# Patient Record
Sex: Female | Born: 1975 | Race: White | Hispanic: No | Marital: Married | Smoking: Former smoker
Health system: Southern US, Community
[De-identification: ages and names within clinical notes are randomized; demographics above are authoritative.]

## PROBLEM LIST (undated history)

## (undated) DIAGNOSIS — R51 Headache: Secondary | ICD-10-CM

## (undated) DIAGNOSIS — K589 Irritable bowel syndrome without diarrhea: Secondary | ICD-10-CM

## (undated) DIAGNOSIS — K219 Gastro-esophageal reflux disease without esophagitis: Secondary | ICD-10-CM

## (undated) DIAGNOSIS — B309 Viral conjunctivitis, unspecified: Secondary | ICD-10-CM

## (undated) DIAGNOSIS — R87619 Unspecified abnormal cytological findings in specimens from cervix uteri: Secondary | ICD-10-CM

## (undated) DIAGNOSIS — D509 Iron deficiency anemia, unspecified: Secondary | ICD-10-CM

## (undated) DIAGNOSIS — K449 Diaphragmatic hernia without obstruction or gangrene: Secondary | ICD-10-CM

## (undated) HISTORY — DX: Unspecified abnormal cytological findings in specimens from cervix uteri: R87.619

## (undated) HISTORY — DX: Headache: R51

## (undated) HISTORY — DX: Gastro-esophageal reflux disease without esophagitis: K21.9

## (undated) HISTORY — DX: Viral conjunctivitis, unspecified: B30.9

## (undated) HISTORY — DX: Irritable bowel syndrome, unspecified: K58.9

## (undated) HISTORY — DX: Iron deficiency anemia, unspecified: D50.9

## (undated) HISTORY — DX: Diaphragmatic hernia without obstruction or gangrene: K44.9

---

## 2005-11-14 ENCOUNTER — Other Ambulatory Visit: Admission: RE | Admit: 2005-11-14 | Discharge: 2005-11-14 | Payer: Self-pay | Admitting: Obstetrics & Gynecology

## 2007-01-09 ENCOUNTER — Other Ambulatory Visit: Admission: RE | Admit: 2007-01-09 | Discharge: 2007-01-09 | Payer: Self-pay | Admitting: Obstetrics & Gynecology

## 2007-05-23 ENCOUNTER — Other Ambulatory Visit: Admission: RE | Admit: 2007-05-23 | Discharge: 2007-05-23 | Payer: Self-pay | Admitting: Obstetrics & Gynecology

## 2007-06-21 ENCOUNTER — Ambulatory Visit (HOSPITAL_COMMUNITY): Admission: RE | Admit: 2007-06-21 | Discharge: 2007-06-21 | Payer: Self-pay | Admitting: Obstetrics & Gynecology

## 2007-08-22 ENCOUNTER — Other Ambulatory Visit: Admission: RE | Admit: 2007-08-22 | Discharge: 2007-08-22 | Payer: Self-pay | Admitting: Obstetrics & Gynecology

## 2007-09-16 HISTORY — PX: LEEP: SHX91

## 2007-10-03 ENCOUNTER — Ambulatory Visit (HOSPITAL_COMMUNITY): Admission: AD | Admit: 2007-10-03 | Discharge: 2007-10-03 | Payer: Self-pay | Admitting: Obstetrics & Gynecology

## 2007-10-03 ENCOUNTER — Encounter: Payer: Self-pay | Admitting: Obstetrics & Gynecology

## 2007-11-19 ENCOUNTER — Encounter: Admission: RE | Admit: 2007-11-19 | Discharge: 2007-11-19 | Payer: Self-pay | Admitting: Family Medicine

## 2008-01-23 ENCOUNTER — Other Ambulatory Visit: Admission: RE | Admit: 2008-01-23 | Discharge: 2008-01-23 | Payer: Self-pay | Admitting: Obstetrics & Gynecology

## 2008-03-16 HISTORY — PX: OTHER SURGICAL HISTORY: SHX169

## 2008-03-24 ENCOUNTER — Ambulatory Visit: Admission: RE | Admit: 2008-03-24 | Discharge: 2008-03-24 | Payer: Self-pay | Admitting: Gynecology

## 2008-04-14 ENCOUNTER — Encounter: Payer: Self-pay | Admitting: Obstetrics & Gynecology

## 2008-04-14 ENCOUNTER — Ambulatory Visit (HOSPITAL_BASED_OUTPATIENT_CLINIC_OR_DEPARTMENT_OTHER): Admission: RE | Admit: 2008-04-14 | Discharge: 2008-04-14 | Payer: Self-pay | Admitting: Obstetrics & Gynecology

## 2008-08-06 ENCOUNTER — Encounter: Payer: Self-pay | Admitting: Family Medicine

## 2008-08-12 ENCOUNTER — Other Ambulatory Visit: Admission: RE | Admit: 2008-08-12 | Discharge: 2008-08-12 | Payer: Self-pay | Admitting: Obstetrics & Gynecology

## 2008-09-15 HISTORY — PX: ABDOMINAL HYSTERECTOMY: SHX81

## 2008-09-18 ENCOUNTER — Inpatient Hospital Stay (HOSPITAL_COMMUNITY): Admission: RE | Admit: 2008-09-18 | Discharge: 2008-09-19 | Payer: Self-pay | Admitting: Obstetrics & Gynecology

## 2008-09-18 ENCOUNTER — Encounter: Payer: Self-pay | Admitting: Obstetrics & Gynecology

## 2009-03-01 ENCOUNTER — Ambulatory Visit: Payer: Self-pay | Admitting: Family Medicine

## 2009-03-01 DIAGNOSIS — D509 Iron deficiency anemia, unspecified: Secondary | ICD-10-CM

## 2009-03-01 DIAGNOSIS — K219 Gastro-esophageal reflux disease without esophagitis: Secondary | ICD-10-CM

## 2009-03-01 DIAGNOSIS — R51 Headache: Secondary | ICD-10-CM

## 2009-03-01 DIAGNOSIS — R519 Headache, unspecified: Secondary | ICD-10-CM | POA: Insufficient documentation

## 2009-03-01 HISTORY — DX: Gastro-esophageal reflux disease without esophagitis: K21.9

## 2009-03-01 HISTORY — DX: Headache: R51

## 2009-03-01 HISTORY — DX: Iron deficiency anemia, unspecified: D50.9

## 2009-03-03 ENCOUNTER — Ambulatory Visit: Payer: Self-pay | Admitting: Family Medicine

## 2009-05-25 ENCOUNTER — Telehealth: Payer: Self-pay | Admitting: Family Medicine

## 2009-09-14 ENCOUNTER — Ambulatory Visit: Payer: Self-pay | Admitting: Family Medicine

## 2009-09-14 LAB — CONVERTED CEMR LAB
ALT: 18 units/L (ref 0–35)
BUN: 14 mg/dL (ref 6–23)
Bilirubin, Direct: 0.1 mg/dL (ref 0.0–0.3)
Calcium: 9 mg/dL (ref 8.4–10.5)
Cholesterol: 187 mg/dL (ref 0–200)
Creatinine, Ser: 0.8 mg/dL (ref 0.4–1.2)
Eosinophils Relative: 0.8 % (ref 0.0–5.0)
GFR calc non Af Amer: 86.97 mL/min (ref >60)
HDL: 77.3 mg/dL (ref >39.00)
LDL Cholesterol: 99 mg/dL (ref 0–99)
Lymphocytes Relative: 20.3 % (ref 12.0–46.0)
MCV: 91.7 fL (ref 78.0–100.0)
Monocytes Absolute: 0.6 10*3/uL (ref 0.1–1.0)
Monocytes Relative: 7.4 % (ref 3.0–12.0)
Neutrophils Relative %: 70.8 % (ref 43.0–77.0)
Platelets: 202 10*3/uL (ref 150.0–400.0)
Total Bilirubin: 0.9 mg/dL (ref 0.3–1.2)
Triglycerides: 53 mg/dL (ref 0.0–149.0)
VLDL: 10.6 mg/dL (ref 0.0–40.0)
WBC: 7.7 10*3/uL (ref 4.5–10.5)

## 2009-09-22 ENCOUNTER — Ambulatory Visit: Payer: Self-pay | Admitting: Family Medicine

## 2009-09-22 DIAGNOSIS — R059 Cough, unspecified: Secondary | ICD-10-CM | POA: Insufficient documentation

## 2009-09-22 DIAGNOSIS — R05 Cough: Secondary | ICD-10-CM

## 2010-06-09 ENCOUNTER — Ambulatory Visit: Payer: Self-pay | Admitting: Family Medicine

## 2010-06-09 DIAGNOSIS — R1032 Left lower quadrant pain: Secondary | ICD-10-CM | POA: Insufficient documentation

## 2010-09-13 ENCOUNTER — Telehealth: Payer: Self-pay | Admitting: Family Medicine

## 2010-11-15 NOTE — Assessment & Plan Note (Signed)
Summary: ab bloatness//ccm   Vital Signs:  Patient profile:   35 year old female Menstrual status:  hysterectomy Weight:      175 pounds Temp:     98.6 degrees F oral BP sitting:   120 / 78  (left arm) Cuff size:   regular  Vitals Entered By: Sid Falcon LPN (June 09, 2010 8:25 AM)  History of Present Illness: Patient seen with abdominal pain past several months. Left lower quadrant greater than right lower quadrant.  Patient saw her gynecologist last week and pelvic exam reportedly normal. Patient was placed on oral contraceptive pills and probiotics without much improvement. She relates several months of intermittent moderate occasionally severe sometimes sharp sometimes crampy pain left lower quadrant. Recently started a job and thinks stress may be a trigger. She has frequent constipation sometimes goes several days without bowel movement. No diarrhea. Question of IBS history. Exercises regularly. Takes plenty of fiber.  Denies any recent appetite changes. No weight loss and in fact has had some recent weight gain. No bloody stools. No fever or chills. Denies nausea or vomiting. Had recent lab work through her employer with normal CBC and normal thyroid function. No family history of bowel problem.  Menses regular.  Allergies: 1)  ! Penicillin V Potassium (Penicillin V Potassium)  Past History:  Past Medical History: Last updated: 03/01/2009 Anemia-iron deficiency GERD Headache/Migraines Hayfever/Allergies  Past Surgical History: Last updated: 03/01/2009 Hysterectomy 2009  Family History: Last updated: 03/01/2009 Family History of Alcoholism/Addiction, blood relative Family History of Arthritis, grandmother Family history lung cancer, grandparent Family history heart diesase, grandparent Family History Hypertension, parent, grandparent diverticulitis, parent  Social History: Last updated: 03/01/2009 Occupation: currently unemployed Married PMH-FH-SH reviewed  for relevance  Review of Systems  The patient denies anorexia, fever, weight loss, melena, hematochezia, severe indigestion/heartburn, hematuria, incontinence, and suspicious skin lesions.    Physical Exam  General:  Well-developed,well-nourished,in no acute distress; alert,appropriate and cooperative throughout examination Mouth:  Oral mucosa and oropharynx without lesions or exudates.  Teeth in good repair. Neck:  No deformities, masses, or tenderness noted. Lungs:  Normal respiratory effort, chest expands symmetrically. Lungs are clear to auscultation, no crackles or wheezes. Heart:  Normal rate and regular rhythm. S1 and S2 normal without gallop, murmur, click, rub or other extra sounds. Abdomen:  soft, normal bowel sounds, no distention, no masses, no hepatomegaly, and no splenomegaly.  very minimal tenderness left lower quadrant to deep palpation   Impression & Recommendations:  Problem # 1:  ABDOMINAL PAIN, LEFT LOWER QUADRANT (ICD-789.04) Assessment New  patient presents with predominant constipation and intermittent pain left lower quadrant. Question of constipation predominant IBS triggered by recent stress. She does not have any red flags such as weight loss, appetite change, or bloody stools and recent labs unremarkable. Continue exercise, fiber segmentation and trial of Amitiza 24 mcg micrograms twice daily. pt with prior hysterectomy so no risk of pregnancy.  Her updated medication list for this problem includes:    Amitiza 24 Mcg Caps (Lubiprostone) ..... One by mouth two times a day  Complete Medication List: 1)  Aciphex 20 Mg Tbec (Rabeprazole sodium) .... Once daily 2)  Anti-hist Allergy 25 Mg Tabs (Diphenhydramine hcl) .... As needed 3)  Calcium 600 1500 Mg Tabs (Calcium carbonate) .... Once daily 4)  Amitiza 24 Mcg Caps (Lubiprostone) .... One by mouth two times a day  Patient Instructions: 1)  Continue regular exercise. 2)  Drinking of fluids and get at least 25  g  fiber in diet per day 3)  Please schedule a follow-up appointment in 1 month.  Prescriptions: AMITIZA 24 MCG CAPS (LUBIPROSTONE) one by mouth two times a day  #60 x 1   Entered and Authorized by:   Evelena Peat MD   Signed by:   Evelena Peat MD on 06/09/2010   Method used:   Electronically to        [facility] [address] (retail)       [address]       [place], [place]  [address]       Ph: [phone]       Fax: [phone]   RxID:   [id number]

## 2010-11-15 NOTE — Progress Notes (Signed)
Summary: recommendation requested, hip pain  Phone Note Call from Patient Call back at Work Phone [phone]   Caller: Patient---live call Summary of Call: need recommendation of a doctor. She is having muscle pain in her hip area. Initial call taken by: Warnell Forester,  September 13, 2010 1:42 PM  Follow-up for Phone Call        We would be happy to evaluate, but if she is wanting to see orthopedic group set up with [facility] orthopedics. Follow-up by: Evelena Peat MD,  September 13, 2010 6:42 PM  Additional Follow-up for Phone Call Additional follow up Details #1::        Pt informed on personally identified VM Additional Follow-up by: Sid Falcon LPN,  September 14, 2010 10:22 AM

## 2011-02-13 ENCOUNTER — Ambulatory Visit (INDEPENDENT_AMBULATORY_CARE_PROVIDER_SITE_OTHER): Payer: Managed Care, Other (non HMO) | Admitting: Family Medicine

## 2011-02-13 ENCOUNTER — Encounter: Payer: Self-pay | Admitting: Family Medicine

## 2011-02-13 VITALS — BP 130/80 | Temp 98.7°F | Ht 66.0 in | Wt 171.0 lb

## 2011-02-13 DIAGNOSIS — R05 Cough: Secondary | ICD-10-CM

## 2011-02-13 MED ORDER — HYDROCODONE-HOMATROPINE 5-1.5 MG/5ML PO SYRP: 5.0000 mL | ORAL_SOLUTION | Freq: Four times a day (QID) | ORAL | Status: AC | PRN

## 2011-02-13 NOTE — Progress Notes (Signed)
  Subjective:    Patient ID: Monica Bailey, female    DOB: 24-Sep-1976, 35 y.o.   MRN: [id number]  HPI Patient seen with five-day history of cough and nasal congestion. Postnasal drip symptoms. Intermittent sore throat. No fever. Sudafed and Benadryl with minimal relief. She questions whether this is allergy related. Denies any body aches or chills. No nausea or vomiting. She is nonsmoker. Cough especially bothersome at night. No wheezing and no dyspnea. No history of reactive airway disease.   Review of Systems  Constitutional: Negative for fever and chills.  HENT: Positive for sore throat and postnasal drip. Negative for ear pain.   Respiratory: Positive for cough. Negative for shortness of breath and wheezing.   Cardiovascular: Negative for chest pain and leg swelling.       Objective:   Physical Exam  Constitutional: She appears well-developed and well-nourished.  HENT:  Right Ear: External ear normal.  Left Ear: External ear normal.  Mouth/Throat: Oropharynx is clear and moist. No oropharyngeal exudate.  Neck: No thyromegaly present.  Cardiovascular: Normal rate, regular rhythm and normal heart sounds.   No murmur heard. Pulmonary/Chest: Effort normal and breath sounds normal. No respiratory distress. She has no wheezes. She has no rales.  Lymphadenopathy:    She has no cervical adenopathy.          Assessment & Plan:  Cough probably related to allergic postnasal drip. Sample Nasonex 2 sprays per nostril once daily. Continue nighttime use of Benadryl. Hycodan cough syrup for severe nighttime cough

## 2011-02-15 ENCOUNTER — Telehealth: Payer: Self-pay | Admitting: *Deleted

## 2011-02-15 MED ORDER — MOMETASONE FUROATE 50 MCG/ACT NA SUSP: 2.0000 | Freq: Every day | NASAL | Status: DC

## 2011-02-15 NOTE — Telephone Encounter (Signed)
Let's add the Nasonex 2 sprays per nostril once daily.  She is already on Hycodan for cough.  Make sure she is taking OTC antihistamine such as Comtrex.

## 2011-02-15 NOTE — Telephone Encounter (Signed)
Notified pt. 

## 2011-02-15 NOTE — Telephone Encounter (Signed)
Pt would like to try the Nasonex that she and Dr. Caryl Never discussed.  Also, she is having a really hard time with continued cough if there is anything she could take.

## 2011-02-28 NOTE — Op Note (Signed)
Monica, Bailey               ACCOUNT NO.:  [id number]   MEDICAL RECORD NO.:  [id number]          PATIENT TYPE:  AMB   LOCATION:  [facility]                          FACILITY:  [facility]   PHYSICIAN:  M. Leda Quail, MD  DATE OF BIRTH:  [birth year]   DATE OF PROCEDURE:  10/03/2007  DATE OF DISCHARGE:                               OPERATIVE REPORT   PREOPERATIVE DIAGNOSES:  39. 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  2. History of previous loop electrosurgical excision procedure.  3. Significant anxiety and difficulty tolerating vaginal procedures.   POSTOPERATIVE DIAGNOSES:  64. 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  2. History of previous loop electrosurgical excision procedure.  3. Significant anxiety and difficulty tolerating vaginal procedures.   PROCEDURE:  LEEP of the anterior portion of the cervix.   SURGEON:  M. Leda Quail, MD   ASSISTANT:  OR staff.   ANESTHESIA:  IV sedation.   FINDINGS:  Two areas with mosaicism noted at 12 o'clock on the cervix.  This was in the exact location of the 2 previous biopsies that showed  CIN II.   SPECIMENS:  LEEP of the cervix containing cervical tissue from 10 to 2  o'clock.  A stitch is placed at the ectocervical margin at 12 o'clock.  This should include the abnormal tissue that has been previously  biopsied.   DISPOSITION OF SPECIMEN:  To pathology.   ESTIMATED BLOOD LOSS:  Minimal.   FLUIDS:  Five hundred mL of LR.   URINE OUTPUT:  One hundred mL of clear urine output drained with Foley  catheter at the beginning of the procedure.   COMPLICATIONS:  None.   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  The patient has  extreme anxiety and difficulty tolerating vaginal procedures including  colposcopy.  She  had a terrible experience with her first LEEP which was  several years prior.  She has had many years of normal Paps until about  the last year and a half or so.  She has had Paps that include suspicion  of high-grade cells and second biopsy that proved CIN II this December.  The colposcopy and biopsy that was performed then was absolutely  terrible for the patient.  She and I decided that we would go ahead and  proceed with treatment of the cervix which would hopefully then allow  for Paps to go back to normal and hopefully prevent future treatments.  She has been counseled about the risks and benefits but specifically  that she might to have future procedures.  The patient is ready to  proceed and is here for this today.   PROCEDURE IN DETAIL:  The patient was taken to the OR.  She was placed  in supine position.  IV sedation was administered by the anesthesia  staff without difficulty.  The patient was  positioned in the Mason  stirrups and then positioned in high lithotomy position.  Using sterile  technique the bladder was drained of urine with a red rubber Foley  catheter.  Then a Pennington speculum was placed in the vagina.  The  cervix was well visualized.  Three percent acetic acid was applied to  the cervix for greater than a minute.  The cervix was visualized under  15x magnification with the colposcope.  The same area of abnormality  that was previously biopsied was noted.  Lugol's was used to also stain  the cervix.  No abnormalities were noted.  Paracervical block of 2%  Xylocaine mixed 1:1 with epinephrine (1:100,000 units) was instilled in  the cervix.  Ten mL in total were instilled at the 3, 6, 9 and 12  o'clock positions respectively.  Then using a 10 x 10 loop electrode the  area of abnormality was excised including the transformation zone.  This  included the portion of the cervix from approximately 10 to 2 o'clock.  A suture was placed at 12 o'clock on the specimen.   The remainder of the  cervix was visualized.  No other abnormalities were noted.  Because I  believe the patient has had a fairly sizable LEEP in the past I did not  want to completely excise the transformation zone again.  Monsel was  placed in the base of the tissue excised.  At this point the procedure  was ended.  The speculum was removed.  The patient tolerated the  procedure well.  No bleeding was noted.  The patient was awakened from  anesthesia and taken to the recovery room in stable condition.      Lum Keas, MD  Electronically Signed     MSM/MEDQ  D:  10/03/2007  T:  10/04/2007  Job:  [id number]

## 2011-02-28 NOTE — Consult Note (Signed)
Monica Bailey, Monica Bailey               ACCOUNT NO.:  [id number]   MEDICAL RECORD NO.:  [id number]          PATIENT TYPE:  OUT   LOCATION:  GYN                          FACILITY:  [facility]   PHYSICIAN:  De Blanch, M.D.DATE OF BIRTH:  Jan 14, 1976   DATE OF CONSULTATION:  DATE OF DISCHARGE:                                 CONSULTATION   REFERRING PHYSICIAN:  M. Leda Quail, MD   CHIEF COMPLAINT:  High-grade dysplasia of the cervix.   HISTORY OF PRESENT ILLNESS:  The patient has a past history of cervical  dysplasia dating back to at least 61.  At that time she had a LEEP  procedure and then claims she had no further abnormal Pap smears until  April of 2008.  Subsequently she has undergone evaluation with a  colposcopy and biopsy and she subsequently underwent a second LEEP  procedure in December of 2008.  Final pathology showed a high-grade  squamous intraepithelial lesion involving the endocervical glands and  focally extending to the inked endocervical margins.  The ectocervical  margin is negative for dysplasia or malignancy.  The patient was then  followed and her repeat Pap smear in April showed persistent high-grade  SIL changes.  The patient is seen in consultation at the request of Dr.  Leda Quail.   The patient reports she has regular cyclic menses, has no other past  gynecologic problems.  Overall her health is good.   REVIEW OF SYSTEMS:  A 10-point comprehensive review of systems is  negative.   PHYSICAL EXAM:  Height 5 feet 6 inches, blood pressure 124/84, pulse 84,  weight 163 pounds.  GENERAL:  The patient is a healthy white female in no acute distress.  HEENT:  Negative.  NECK:  Supple without thyromegaly.  There is no supraclavicular or  inguinal adenopathy.  ABDOMEN:  Soft, nontender.  No mass, organomegaly, ascites or hernias  noted.  PELVIC EXAM:  EG/BUS, vagina, bladder, and urethra are normal.  Cervix  appears normal with a small ectropion.   Uterus is anterior, normal  shape, size and consistency.  There are no adnexal masses noted.   PROCEDURE NOTE:  Colposcopic examination of the cervix is performed.  The squamocolumnar junction is easily seen and no obvious lesions are  noted.  The upper vagina is also colposcoped with no obvious  abnormalities.   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  I had a lengthy discussion with the patient and her husband  regarding management options.  In the initial portion of the discussion,  the patient indicated she thinks she would prefer to have a hysterectomy  so as not to have to worry about these problems further.  Childbearing  does not seem to be a high priority for her in our discussion.  However,  on further discussion and of treatment options, I indicated that if she  did not wish to have a hysterectomy that she should undergo a cold knife  conization with an attempt to go high into the endocervical canal.  After discussing the pros and cons of this  approach, the potential for  recurrence in the cervix versus the potential for preservation of  fertility, the patient remains undecided as to how she would like to  proceed.  I did indicate that if she had a hysterectomy, the ovaries  would not need to be removed.  Further I did indicate that she would  still need undergo surveillance with Pap smears given the fact that this  is human papilloma virus-related and that she could in the future  develop a dysplasia in the vagina or vulva.   All of her questions were answered and she will return to see Dr. Hyacinth Meeker  to discuss and make plans for further treatment.      De Blanch, M.D.  Electronically Signed     DC/MEDQ  D:  03/24/2008  T:  03/24/2008  Job:  [id number]   cc:   Leda Quail, MD   Telford Nab, R.N.  [address]  [place], [place] [address]

## 2011-02-28 NOTE — Op Note (Signed)
Monica Bailey, Monica Bailey               ACCOUNT NO.:  [id number]   MEDICAL RECORD NO.:  [id number]          PATIENT TYPE:  INP   LOCATION:  9309                          FACILITY:  [facility]   PHYSICIAN:  M. Leda Quail, MD  DATE OF BIRTH:  30-Aug-1976   DATE OF PROCEDURE:  09/18/2008  DATE OF DISCHARGE:                               OPERATIVE REPORT   PREOPERATIVE DIAGNOSES:  96. A 35 year old G0, married white female with recurrent cervical      dysplasia.  2. Cervical stenosis.  3. Hematocolpos.   POSTOPERATIVE DIAGNOSES:  13. A 35 year old G0, married white female with recurrent cervical      dysplasia.  2. Cervical stenosis.  3. Hematocolpos.   PROCEDURE:  Total abdominal hysterectomy.   SURGEON:  M. Leda Quail, MD   ASSISTANT:  Edwena Felty. Romine, MD   ANESTHESIA:  General endotracheal.   FINDINGS:  Small uterus with normal-appearing ovaries and tubes, normal  pelvis with a shortened cervix.   SPECIMENS:  Uterus and cervix were sent to Pathology.   ESTIMATED BLOOD LOSS:  Less than 50 mL.   URINE OUTPUT:  100 mL of clear urine in the Foley catheter, it is  somewhat concentrated.   FLUIDS:  1200 mL of LR.   COMPLICATIONS:  None.   INDICATIONS:  [name] is a very nice 35 year old, G0, married white  female who is well known to me.  I have followed her over the last 3  years.  She has a history of recurrent cervical dysplasia.  Years ago,  she had a LEEP, which I felt was a very large excisional procedure due  to the size of ectropion that was on her cervix.  She has ultimately had  recurrent CIN-3 dysplasia with high-grade Paps.  I attempted a LEEP  approximately 1 year ago that still had positive margins.  She saw Dr.  Stanford Breed at that time who recommended either a repeat excisional  procedure if her Pap continue to be abnormal versus a hysterectomy, and  she opted for a conservative management.  Her next Pap was again an  abnormal high-grade Pap, and  again, the abnormal cells were still noted.  Therefore, we opted for cold knife cone.  This was performed without  difficulty.  She had a normal menstrual cycle 1 month after the  procedure and then reported no additional cycles for the next 3 months.  She came for her followup Pap smear, which was normal, but she had a  very stenotic cervix that was noted.  An ultrasound showed a  hematocolpos, and an attempt was made to dilate the cervix to release  the blood with an office procedure.  She did not want to go back to the  operating room to have this done.  Her cervix was quite short after  these 3 excisional procedures, and the scarring on her cervix was fairly  significant.  At this point, she is absolutely ready to be done with her  history of abnormal Pap smears, and she is ready to proceed with  definitive management.  Even though her last  Pap smear was normal, I was  concerned because of the cervical stenosis that I may not have gotten  hopefully adequate Pap smear, and without cervical dilation, I do not  feel that I can completely reassure her that her Paps are indeed normal.  She has taken this into account as well.  I had a lengthy discussion  with them, and they are very aware that hysterectomy would mean no  potential for childbearing with the patient carrying the pregnancy  herself; however, they are completely comfortable with this and are  ready to proceed.  Risks and benefits have been discussed with the  patient.  They are documented in my office chart.   PROCEDURE IN DETAIL:  The patient presents today through same-day  surgery.  She was taken to the operating room.  She was placed in supine  position.  Pillows placed under her knees.  General endotracheal  anesthesia was administered by the Anesthesia without difficulty.  Legs  were placed in the frogleg position.  Abdomen, perineum, inner thighs,  and vagina were prepped in normal sterile fashion.  Foley catheter was   inserted into the bladder without difficulty.  The patient was then  draped in normal sterile fashion.   Using a knife, a Pfannenstiel skin incision was made.  The subcutaneous  fat tissue was dissected.  This was carried down to the fascial layer,  which was nicked in the midline.  The fascia was extended bilaterally  using curved Mayo scissors.  The fascia was then picked up, was then  elevated in the midline with Kocher clamps, and the underlying rectus  muscles were dissected off sharply both superiorly and inferiorly.  Rectus muscles were divided in the midline.  Then, superiorly on the  incision, the preperitoneal fat was dissected.  The peritoneum was  identified, popped through bluntly with hemostats.  Operator's index  finger was used to feel underneath the peritoneum, which was smooth.  There was no adhesions noted.  The peritoneal incision was extended  superiorly and inferiorly without difficulty.  Pelvis was surveyed.  The  uterus was small, ovaries appeared normal, tubes appeared normal, the  upper abdomen was normal.  The liver edge was smooth.  There was no  nodularity or mass tenderness noted.  At this point, a Balfour retractor  was placed in the abdomen.  Care was taken to note the location of the  bowels.  The bowels were packed superiorly with two moist laparotomy  sponges.  A bladder blade was placed inferiorly.   Long Kelly clamps were placed along the utero-ovarian ligament.  The  round ligament on each side was identified, elevated, suture ligated  with #0 Vicryl and incised.  On the right side, the inferior leaf of  broad ligament was opened and incised with curved Mayo scissors to the  level of the internal os of the cervix.  Then, the utero-ovarian  ligament was isolated, clamped with curved Heaney clamp, transected, and  doubly suture ligated with #0 Vicryl.  On the left side, after the round  ligament was suture ligated and incised, The anterior leaf of the  broad  ligament was opened to the level of the internal os of the cervix in the  midline connecting with the previous incision done on the right side.  The left utero-ovarian pedicle was isolated, clamped with curved Heaney  clamp, transected, and suture ligated twice with #0 Vicryl.  First was a  free tie, and second was a stitch  tie.  This was done exactly the same  on both sides.  The ureters had been identified before any dissection  around the ovaries was performed.  The bladder flap was then created  upon lifting the peritoneum superiorly and dissecting in the plane  between the bladder and the cervix.  The pubovesicocervical fascia was  dissected without difficulty and without bleeding.  Once this was  dissected down the cervix,  sponge stick was used to push the bladder  down well out of the way of the surgical dissection field.  The uterine  arteries were skeletonized bilaterally, clamped bilaterally, incised  bilaterally, and doubly suture ligated with #0 Vicryl bilaterally.  Then, the cardinal ligament was serially clamped, transected, and suture  ligated with #0 Vicryl on the left and the right side.  No further  dissection of the bladder flap was needed because the cervix felt fairly  short.  At the base of the cervix, curved Heaney clamps were placed on  either side bilaterally.  These were incised, and then, the pedicles  were suture ligated with a Heaney stitch with #0 Vicryl.  At this point,  the vaginal mucosa had been entered.  One additional clamp across the  base of the cervix allowed the cervix to be amputated off the vaginal  mucosa.  Heaney stitch of #0 Vicryl was placed, and this closed the cuff  completely.  The cervix and uterus were handed off to be sent to  Pathology.  The pelvis was irrigated with warm normal saline.  No  bleeding was noted.  The cuff was hemostatic.  The bladder flap was  hemostatic.  Utero-ovarian pedicles were hemostatic.  The ureters  were  peristalsing bilaterally.  Any long sutures at this point  were cut  short.  The bladder blade was removed.  The Balfour retractor was  removed, and the two moist laparotomy sponges were removed.  The omentum  and bowel were placed back in normal anatomic position.  The peritoneum,  at this point, was closed with a running suture of #1-0 Vicryl.  The  rectus muscles and fascial tissue were visualized and irrigated.  No  bleeding was noted.  An On-Q pump tubing was placed on the left side  subfascially.  This had been primed with 1% lidocaine.  The fascia was  then closed, starting at the corners and running to the midline  bilaterally.  This was closed with 0 Vicryl.  Subcutaneous fat tissue  was irrigated.  No bleeding was noted.  Subcutaneous On-Q pump tubing  was placed on the right side.  The tubing was attached to the skin with  Steri-Strips and Tegaderm.  No bleeding was noted subcutaneously.  The  skin was then closed with subcuticular stitch of 3-0 Vicryl.  The  incision was cleansed.  Benzoin and Steri-Strips were applied.  A 5 mL  of 1% lidocaine was instilled subcutaneously and 10 mL was instilled  subfascially to prime the On-Q pump tubing.   At this point, the procedure was ended.  The patient tolerated the  procedure well.  Sponge, lap, needle, and instrument counts were correct  x2.  The patient was awakened from anesthesia without difficulty,  extubated, and taken to the recovery room in stable condition.       Lum Keas, MD  Electronically Signed     MSM/MEDQ  D:  09/18/2008  T:  09/18/2008  Job:  [id number]

## 2011-02-28 NOTE — Op Note (Signed)
Monica Bailey, Monica Bailey               ACCOUNT NO.:  [id number]   MEDICAL RECORD NO.:  [id number]          PATIENT TYPE:  AMB   LOCATION:  [facility]                         [facility]ACILITY:  [facility]   PHYSICIAN:  M. Leda Quail, MD  DATE OF BIRTH:  18-May-1976   DATE OF PROCEDURE:  04/14/2008  DATE OF DISCHARGE:                               OPERATIVE REPORT   PREOPERATIVE DIAGNOSES:  19. 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  2. History of loop electrosurgical excision procedure x2.  3. Hiatal hernia.  4. Gastroesophageal reflux disease.   POSTOPERATIVE DIAGNOSES:  33. 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  2. History of loop electrosurgical excision procedure x2.  3. Hiatal hernia.  4. Gastroesophageal reflux disease.   PROCEDURE:  Cold knife cone of the cervix.   SURGEON:  M. Leda Quail, MD   ASSISTANT:  OR staff.   ANESTHESIA:  LMA.   FINDINGS:  Acetowhite epithelium at 10 to 12 o'clock noted with  colposcopy.   SPECIMENS:  Cervical conization with a stitch at 12 o'clock, sent to  pathology.   ESTIMATED BLOOD LOSS:  Less than 50 mL.   URINE OUTPUT:  50 mL of concentrated urine drained at the beginning of  the case with a Robinson catheter.   FLUIDS:  1000 mL of LR.   COMPLICATIONS:  None.   INDICATIONS:  Monica Bailey is a very nice 35 year old G0 married white  female who has a remote history of an abnormal Pap smear and a LEEP of  the cervix.  I first saw her about 2 years ago in January 2007.  She had  a negative Pap then.  A year later in March 2008 she had an ASCUS Pap  smear with positive HPV.  This was initially evaluated with a  colposcopy.  She did have a fairly large ectropion on the cervix.  She  did have some acetowhite epithelium of the cervix that was at 10 to 12  o'clock.  The biopsy of this was  obtained and this showed CIN-II.  A  follow-up Pap was done in August 2008, which was again an ASCUS Pap with  high-risk HPV, then a follow-up Pap in November 2008, which showed a  high-grade SIL.  She came back in for colposcopy and again biopsy showed  CIN-II.  Because of this we decided to go ahead and proceed with a  partial LEEP of the cervix.  The area of abnormal cells appeared to be  concentrated at 10 to 12 o'clock and I did not want to remove any more  of her cervix than was necessary.  She was taken to the operating room  in December 2008 and a partial LEEP was performed.  CIN II and III were  noted with some involvement of the endocervical glands.  She had a  positive endocervical margin.  The patient was followed up with a Pap  smear in April  of this year, which was again a high-grade Pap smear.  I  discussed with her proceeding with a cold knife cone versus  consideration of a hysterectomy, and I also offered a consultation with  Dr. Stanford Breed.  She did see Dr. Stanford Breed, who recommended a  conization of the cervix but did discuss a possible hysterectomy as  well.  She came back to me and asked if I would do this procedure since  we have gotten to know each other very well through this process.  She  presents for this today.   PROCEDURE:  The patient is taken to the operating room.  She is placed  in supine position.  Anesthesia is administered by the anesthesia staff  without difficulty.  Legs are positioned in Dumfries stirrups in the low  lithotomy position and then lifted to the high lithotomy position.  The  patient is then prepped with Betadine in normal sterile fashion.  She is  draped in normal sterile fashion.  A Robinson Foley catheter is used to  drain the bladder of all urine.  A bivalve speculum is placed in vagina  and 5% acetic acid is applied to the cervix.  A colposcopy is performed.  The acetowhite epithelium is noted in the same location as it has  been  in the past.  There was some mosaicism noted to it.  At this point the  bivalve speculum was removed, sterile gloves were changed.  A heavy  weighted speculum was placed in the posterior aspect of the vagina and a  Sims retractor was used to retract anteriorly.  A figure-of-eight suture  was placed at both the 3 and 9 o'clock positions on the cervix.  Then 1%  lidocaine mixed 1:1 with epinephrine (1:100,000 units) was instilled at  the 3, 6, 9 and 12 position on the cervix.  Then using a #11 blade, the  area outside of the ectropion and the area of the transition zone was  incised.  An Allis clamp was placed on this beginning of the conization  of the cervix and retracted.  The cervix was incised to create a cone-  shaped structure and then was cut at the base with a curved Mayo  scissors.  Cautery with a Bovie set on 50 was used to cauterize the cone  bed and then Monsel solution was applied.  At this point good hemostasis  was noted.  The sutures were kept in place but the tails were cut short.  The patient tolerated the procedure well.  Her legs were taken out of  the supine position after all instruments removed from the vagina.  Sponge, lap, needle and instrument counts were correct x2.  The skin was  cleansed of Betadine and she was awakened from anesthesia without  difficulty.      Lum Keas, MD  Electronically Signed     MSM/MEDQ  D:  04/14/2008  T:  04/14/2008  Job:  [id number]

## 2011-04-27 ENCOUNTER — Other Ambulatory Visit: Payer: Self-pay | Admitting: Obstetrics & Gynecology

## 2011-04-27 DIAGNOSIS — R102 Pelvic and perineal pain: Secondary | ICD-10-CM

## 2011-05-01 ENCOUNTER — Ambulatory Visit
Admission: RE | Admit: 2011-05-01 | Discharge: 2011-05-01 | Disposition: A | Discharge status: Home / Self Care | Payer: Managed Care, Other (non HMO) | Source: Ambulatory Visit | Attending: Obstetrics & Gynecology | Admitting: Obstetrics & Gynecology

## 2011-05-01 DIAGNOSIS — R102 Pelvic and perineal pain: Secondary | ICD-10-CM

## 2011-07-13 LAB — URINALYSIS, ROUTINE W REFLEX MICROSCOPIC
Glucose, UA: NEGATIVE
Nitrite: NEGATIVE
Protein, ur: NEGATIVE
Specific Gravity, Urine: 1.026
Urobilinogen, UA: 0.2
pH: 5.5

## 2011-07-13 LAB — CBC
HCT: 37
MCV: 95.1
Platelets: 202
WBC: 5

## 2011-07-13 LAB — URINE MICROSCOPIC-ADD ON

## 2011-07-13 LAB — POCT PREGNANCY, URINE
Operator id: 268271
Preg Test, Ur: NEGATIVE

## 2011-07-21 LAB — CBC
HCT: 32.1 — ABNORMAL LOW
Hemoglobin: 10.5 g/dL — ABNORMAL LOW (ref 12.0–15.0)
Hemoglobin: 11.4 — ABNORMAL LOW
MCHC: 35.4 g/dL (ref 30.0–36.0)
Platelets: 214 10*3/uL (ref 150–400)
Platelets: 223
RBC: 3.1 MIL/uL — ABNORMAL LOW (ref 3.87–5.11)
RDW: 14.3 % (ref 11.5–15.5)
RDW: 12.6
WBC: 8.7 10*3/uL (ref 4.0–10.5)
WBC: 6.2

## 2011-07-21 LAB — BASIC METABOLIC PANEL
BUN: 7 mg/dL (ref 6–23)
Calcium: 8.8 mg/dL (ref 8.4–10.5)
GFR calc non Af Amer: >60 mL/min (ref >60)
Glucose, Bld: 84 mg/dL (ref 70–99)

## 2011-07-21 LAB — TYPE AND SCREEN: Antibody Screen: NEGATIVE

## 2011-07-21 LAB — URINALYSIS, ROUTINE W REFLEX MICROSCOPIC
Bilirubin Urine: NEGATIVE
Protein, ur: NEGATIVE mg/dL
Specific Gravity, Urine: 1.02 (ref 1.005–1.030)
Urobilinogen, UA: 0.2 mg/dL (ref 0.0–1.0)

## 2011-07-21 LAB — URINE MICROSCOPIC-ADD ON

## 2011-09-13 ENCOUNTER — Telehealth: Payer: Self-pay

## 2011-09-13 NOTE — Telephone Encounter (Signed)
Called pt - we recv'd fax from [facility] requesting a switch in PPI from aciphex to omeprazole because of cost savings. Dr. Caryl Never said ok IF pt wanted. Pt declines switch and want to continue with current therapy. KIK

## 2011-09-27 ENCOUNTER — Other Ambulatory Visit: Payer: Self-pay | Admitting: Family Medicine

## 2011-10-03 ENCOUNTER — Telehealth: Payer: Self-pay | Admitting: *Deleted

## 2011-10-03 NOTE — Telephone Encounter (Signed)
Pt called back and stated she did not request a change. Pt would like to stay on Aciphex.

## 2011-10-03 NOTE — Telephone Encounter (Signed)
We received a medication change request from [facility] from Aciphex tp Omeprazole.  Per Dr Caryl Never, if OK with pt, OK with him.  Msg left on pt home VM

## 2011-10-04 NOTE — Telephone Encounter (Signed)
Form discarded, not approved

## 2011-11-02 ENCOUNTER — Other Ambulatory Visit: Payer: Self-pay | Admitting: *Deleted

## 2011-11-02 NOTE — Telephone Encounter (Signed)
Fax received from [facility] asking if Aciphex could be changed to Omeprazole.  Pt prefers to stay with brand.  [facility] informed "Brand necessary", faxed form back, confirmation received

## 2011-12-25 ENCOUNTER — Encounter: Payer: Self-pay | Admitting: Family Medicine

## 2011-12-25 ENCOUNTER — Ambulatory Visit (INDEPENDENT_AMBULATORY_CARE_PROVIDER_SITE_OTHER): Payer: Managed Care, Other (non HMO) | Admitting: Family Medicine

## 2011-12-25 VITALS — BP 120/84 | Temp 98.5°F | Wt 165.0 lb

## 2011-12-25 DIAGNOSIS — R05 Cough: Secondary | ICD-10-CM

## 2011-12-25 DIAGNOSIS — J019 Acute sinusitis, unspecified: Secondary | ICD-10-CM

## 2011-12-25 MED ORDER — HYDROCOD POLST-CHLORPHEN POLST 10-8 MG/5ML PO LQCR: 5.0000 mL | Freq: Two times a day (BID) | ORAL | Status: DC | PRN

## 2011-12-25 MED ORDER — AZITHROMYCIN 250 MG PO TABS: ORAL_TABLET | ORAL | Status: AC

## 2011-12-25 NOTE — Progress Notes (Signed)
  Subjective:    Patient ID: Monica Bailey, female    DOB: 1976-07-17, 36 y.o.   MRN: [id number]  HPI  Acute visit. 2 week history of sinus congestive symptoms. Was feeling better and last week and then relapsed. She's had increased cough mostly nonproductive and diffuse facial pressure maxillary and frontal sinus regions. Increased malaise. Denies any fever or chills. No sore throat. Denies any nausea or vomiting. Taking Sudafed without much relief.   Review of Systems  Constitutional: Negative for fever and chills.  HENT: Positive for congestion, postnasal drip and sinus pressure. Negative for ear pain.   Respiratory: Positive for cough. Negative for wheezing.        Objective:   Physical Exam  Constitutional: She appears well-developed and well-nourished.  HENT:  Right Ear: External ear normal.  Left Ear: External ear normal.  Mouth/Throat: Oropharynx is clear and moist.  Neck: Neck supple.  Cardiovascular: Normal rate and regular rhythm.   Pulmonary/Chest: Effort normal and breath sounds normal. No respiratory distress. She has no wheezes. She has no rales.  Lymphadenopathy:    She has no cervical adenopathy.          Assessment & Plan:  Acute sinusitis. Zithromax for 5 days. Tussionex as needed for nighttime cough and postnasal drip symptoms

## 2012-10-16 HISTORY — PX: ESOPHAGOGASTRODUODENOSCOPY: SHX1529

## 2013-10-29 ENCOUNTER — Other Ambulatory Visit: Payer: Self-pay | Admitting: Obstetrics & Gynecology

## 2013-10-29 NOTE — Telephone Encounter (Signed)
Xanax refill requested. AEX 12/12/13 with Dr. Hyacinth MeekerMiller. Patient had AEX at another office last year as she had moved out of the area.  [facility] [place] [phone]

## 2013-10-29 NOTE — Telephone Encounter (Signed)
Refill request for XANAX Last filled by MD on - 05/09/12, #30 X 0 Last AEX - 01/2012 our office.  Pt moved to [place], and states had AEX there. Next AEX - 12/12/13 in our office. Please advise refills.  Chart in your basket.

## 2013-11-17 ENCOUNTER — Encounter: Payer: Self-pay | Admitting: Family Medicine

## 2013-11-17 ENCOUNTER — Ambulatory Visit (INDEPENDENT_AMBULATORY_CARE_PROVIDER_SITE_OTHER): Payer: BC Managed Care – PPO | Admitting: Family Medicine

## 2013-11-17 VITALS — BP 122/80 | HR 63 | Temp 98.3°F | Wt 176.0 lb

## 2013-11-17 DIAGNOSIS — J019 Acute sinusitis, unspecified: Secondary | ICD-10-CM

## 2013-11-17 MED ORDER — AZITHROMYCIN 250 MG PO TABS: ORAL_TABLET | ORAL | Status: AC

## 2013-11-17 NOTE — Progress Notes (Signed)
Pre visit review using our clinic review tool, if applicable. No additional management support is needed unless otherwise documented below in the visit note. 

## 2013-11-17 NOTE — Patient Instructions (Signed)

## 2013-11-17 NOTE — Progress Notes (Signed)
   Subjective:    Patient ID: Monica Bailey, female    DOB: 11/21/1975, 38 y.o.   MRN: [id number]  HPI Patient seen for acute visit Onset last week of cough and sinus congestion with intermittent headaches. She used Sudafed without much relief. She has had colored nasal discharge as well as productive cough. No fever. Increased malaise. Patient is a nonsmoker and generally very healthy.  Past Medical History  Diagnosis Date  . 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 GERD 03/01/2009  . Headache(784.0) 03/01/2009  . Abdominal pain, left lower quadrant 06/09/2010   Past Surgical History  Procedure Laterality Date  . Abdominal hysterectomy      reports that she quit smoking about 16 years ago. Her smoking use included Cigarettes. She has a 6 pack-year smoking history. She does not have any smokeless tobacco history on file. Her alcohol and drug histories are not on file. family history includes Alcohol abuse in her other; Arthritis in her other; Cancer in her other; Heart disease in her other; Hypertension in her other. Allergies  Allergen Reactions  . Penicillins       Review of Systems  Constitutional: Positive for fatigue. Negative for fever and chills.  HENT: Positive for congestion.   Respiratory: Positive for cough.   Neurological: Positive for headaches.       Objective:   Physical Exam  Constitutional: She appears well-developed and well-nourished.  HENT:  Right Ear: External ear normal.  Left Ear: External ear normal.  Mouth/Throat: Oropharynx is clear and moist.  Neck: Neck supple. No thyromegaly present.  Cardiovascular: Normal rate.   Pulmonary/Chest: Effort normal and breath sounds normal. No respiratory distress. She has no wheezes. She has no rales.          Assessment & Plan:  Acute sinusitis. We explained most of these are viral. We've recommended observation for now. If symptoms persist or worsen, start Zithromax. Continue symptomatic measures

## 2013-12-12 ENCOUNTER — Ambulatory Visit: Payer: Self-pay | Admitting: Obstetrics & Gynecology

## 2014-01-30 ENCOUNTER — Ambulatory Visit: Payer: Self-pay | Admitting: Obstetrics & Gynecology

## 2014-02-06 ENCOUNTER — Other Ambulatory Visit: Payer: Self-pay

## 2014-02-06 ENCOUNTER — Encounter: Payer: Self-pay | Admitting: Family Medicine

## 2014-02-06 MED ORDER — RABEPRAZOLE SODIUM 20 MG PO TBEC: 20.0000 mg | DELAYED_RELEASE_TABLET | Freq: Every day | ORAL | Status: DC

## 2014-02-09 ENCOUNTER — Other Ambulatory Visit: Payer: Self-pay

## 2014-02-09 MED ORDER — RABEPRAZOLE SODIUM 20 MG PO TBEC: 20.0000 mg | DELAYED_RELEASE_TABLET | Freq: Every day | ORAL | Status: DC

## 2014-03-02 ENCOUNTER — Encounter: Payer: Self-pay | Admitting: Obstetrics & Gynecology

## 2014-03-02 ENCOUNTER — Ambulatory Visit: Payer: Self-pay | Admitting: Obstetrics & Gynecology

## 2014-03-11 ENCOUNTER — Ambulatory Visit: Payer: Self-pay | Admitting: Obstetrics & Gynecology

## 2014-04-09 ENCOUNTER — Encounter: Payer: Self-pay | Admitting: Obstetrics & Gynecology

## 2014-04-10 ENCOUNTER — Ambulatory Visit (INDEPENDENT_AMBULATORY_CARE_PROVIDER_SITE_OTHER): Payer: BC Managed Care – PPO | Admitting: Obstetrics & Gynecology

## 2014-04-10 ENCOUNTER — Encounter: Payer: Self-pay | Admitting: Obstetrics & Gynecology

## 2014-04-10 VITALS — BP 102/64 | HR 68 | Resp 16 | Ht 66.5 in | Wt 171.4 lb

## 2014-04-10 DIAGNOSIS — Z124 Encounter for screening for malignant neoplasm of cervix: Secondary | ICD-10-CM

## 2014-04-10 DIAGNOSIS — Z01419 Encounter for gynecological examination (general) (routine) without abnormal findings: Secondary | ICD-10-CM

## 2014-04-10 NOTE — Progress Notes (Signed)
38 y.o. G0P0000 MarriedCaucasianF here for annual exam.  [place]  She and her husband both kept their jobs when they moved back from [place].  No bleeding.    Had blood work in 2013. Everything was ok then.  Will reestablish care with Dr. Caryl NeverBurchette.  Having some vulvar itching that she thinks is related to warts.  Patient's last menstrual period was 10/17/2007.          Sexually active: Yes.    The current method of family planning is status post hysterectomy.    Exercising: Yes.    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 Her smoking use included Cigarettes. She has a 6 pack-year smoking history. She has never used smokeless tobacco. She reports that she drinks about 4 ounces of alcohol per week. She reports that she does not use illicit drugs.  Past Medical History  Diagnosis Date  . 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 GERD 03/01/2009  . Headache(784.0) 03/01/2009  . Abdominal pain, left lower quadrant 06/09/2010  . Viral conjunctivitis     h/o left eye  . Hiatal hernia   . 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 IBS (irritable bowel syndrome)     worse in college    Past Surgical History  Procedure Laterality Date  . Abdominal hysterectomy    . Leep  12/08    CIN II/III involving glandsand with positive endocervical margin  . Ckc  6/09    CIN I/III with negative margins  . Clavicle surgery      Current Outpatient Prescriptions  Medication Sig Dispense Refill  . Cyanocobalamin (VITAMIN B 12 PO) Take by mouth.      . diphenhydrAMINE (BENADRYL) 25 mg capsule Take 25 mg by mouth every 6 (six) hours as needed.         . IRON PO Take by mouth daily.      . meloxicam (MOBIC) 15 MG tablet daily.      . RABEprazole (ACIPHEX) 20 MG tablet Take 1 tablet (20 mg total) by mouth daily. Generic Necessary  30 tablet  2   No current facility-administered medications for this visit.    Family History  Problem Relation Age of Onset  . Arthritis Other   . Alcohol abuse Other   . Cancer Other     lung  . Heart disease Other   . Hypertension Other     maternal side  . Diabetes Maternal Grandmother   . Hypertension Mother   . Hypertension Father   . Heart disease Maternal Grandfather   . Osteoporosis Maternal Grandmother     ROS:  Pertinent items are noted in HPI.  Otherwise, a comprehensive ROS was negative.  Exam:   BP 102/64  Pulse 68  Resp 16  Ht 5' 6.5" (1.689 m)  Wt 171 lb 6.4 oz (77.747 kg)  BMI 27.25 kg/m2  LMP 10/17/2007  Weight change: +4#   Height: 5' 6.5" (168.9 cm)  Ht Readings from Last 3 Encounters:  04/10/14 5' 6.5" (1.689 m)  02/13/11 5\' 6"  (1.676 m)  09/22/09 5' 6.25" (1.683 m)  General appearance: alert, cooperative and appears stated age Head: Normocephalic, without obvious abnormality, atraumatic Neck: no adenopathy, supple, symmetrical, trachea midline and thyroid normal to inspection and palpation Lungs: clear to auscultation bilaterally Breasts: normal appearance, no masses or tenderness Heart: regular rate and rhythm Abdomen: soft, non-tender; bowel sounds normal; no masses,  no organomegaly Extremities: extremities normal, atraumatic, no cyanosis or edema Skin: Skin color, texture, turgor normal. No rashes or lesions Lymph nodes: Cervical, supraclavicular, and axillary nodes normal. No abnormal inguinal nodes palpated Neurologic: Grossly normal   Pelvic: External genitalia:  no lesions              Urethra:  normal appearing urethra with no masses, tenderness or lesions              Bartholins and Skenes: normal                 Vagina: normal appearing  vagina with normal color and discharge, no lesions              Cervix: absent              Pap taken: Yes.   Bimanual Exam:  Uterus:  uterus absent              Adnexa: normal adnexa and no mass, fullness, tenderness               Rectovaginal: Confirms               Anus:  normal sphincter tone, no lesions  A:  Well Woman with normal exam H/O TAH due to recurrent dysplasia 12/09 Vulvar sebaceous cysts  P:   Mammogram starting after 39th birthday.  Information provided. pap smear only obtained today. return annually or prn  An After Visit Summary was printed and given to the patient.

## 2014-04-13 LAB — IPS PAP SMEAR ONLY

## 2014-04-14 ENCOUNTER — Other Ambulatory Visit: Payer: Self-pay

## 2014-04-14 ENCOUNTER — Encounter: Payer: Self-pay | Admitting: Obstetrics & Gynecology

## 2014-04-14 NOTE — Telephone Encounter (Signed)
Message copied by Elisha HeadlandNIX, KELLY S on Tue Apr 14, 2014 11:03 AM ------      Message from: Jerene BearsMILLER, MARY S      Created: Mon Apr 13, 2014  5:31 PM       Please call pt.  Pap normal.  02 recall.  Yeast present.  If symptomatic, treat with Diflucan 150mg  po x 1, repeat 72 hours if needed. ------

## 2014-04-14 NOTE — Telephone Encounter (Signed)
Patient notified of results. Would like the rx for diflucan just in case. I have pended the order for that because she said she emailed through [employer] requesting a second rx and asked if we could send them both at the same time. Please advise.

## 2014-04-15 MED ORDER — FLUCONAZOLE 150 MG PO TABS: ORAL_TABLET | ORAL | Status: DC

## 2014-04-16 ENCOUNTER — Telehealth: Payer: Self-pay

## 2014-04-16 MED ORDER — ALPRAZOLAM 0.25 MG PO TABS: 0.2500 mg | ORAL_TABLET | Freq: Every evening | ORAL | Status: DC | PRN

## 2014-04-16 NOTE — Telephone Encounter (Signed)
Prescription for Xanax 0.25mg  [place] [facility] [place] to [phone]. Okay per [employer] [facility] message from Dr.Miller on 04/16/14 see encounter. Fax confirmation received.

## 2014-04-16 NOTE — Telephone Encounter (Signed)
Rx done.  Will be faxed to pharmacy and pt will be made aware.

## 2014-05-07 ENCOUNTER — Other Ambulatory Visit: Payer: BC Managed Care – PPO

## 2014-05-08 ENCOUNTER — Other Ambulatory Visit (INDEPENDENT_AMBULATORY_CARE_PROVIDER_SITE_OTHER): Payer: BC Managed Care – PPO

## 2014-05-08 DIAGNOSIS — Z0000 Encounter for general adult medical examination without abnormal findings: Secondary | ICD-10-CM

## 2014-05-08 LAB — BASIC METABOLIC PANEL
BUN: 12 mg/dL (ref 6–23)
CALCIUM: 9.6 mg/dL (ref 8.4–10.5)
CO2: 28 meq/L (ref 19–32)
CREATININE: 0.8 mg/dL (ref 0.4–1.2)
Chloride: 108 mEq/L (ref 96–112)
GFR: 89.95 mL/min (ref >60.00)
GLUCOSE: 87 mg/dL (ref 70–99)
Potassium: 4.7 mEq/L (ref 3.5–5.1)
Sodium: 140 mEq/L (ref 135–145)

## 2014-05-08 LAB — HEPATIC FUNCTION PANEL
ALK PHOS: 37 U/L — AB (ref 39–117)
ALT: 39 U/L — ABNORMAL HIGH (ref 0–35)
AST: 35 U/L (ref 0–37)
Albumin: 4.2 g/dL (ref 3.5–5.2)
BILIRUBIN DIRECT: 0.1 mg/dL (ref 0.0–0.3)
BILIRUBIN TOTAL: 0.6 mg/dL (ref 0.2–1.2)
Total Protein: 7.6 g/dL (ref 6.0–8.3)

## 2014-05-08 LAB — CBC WITH DIFFERENTIAL/PLATELET
BASOS ABS: 0 10*3/uL (ref 0.0–0.1)
Basophils Relative: 0.3 % (ref 0.0–3.0)
EOS ABS: 0.1 10*3/uL (ref 0.0–0.7)
Eosinophils Relative: 1 % (ref 0.0–5.0)
HCT: 36.2 % (ref 36.0–46.0)
Hemoglobin: 12.3 g/dL (ref 12.0–15.0)
LYMPHS PCT: 22.6 % (ref 12.0–46.0)
Lymphs Abs: 1.2 10*3/uL (ref 0.7–4.0)
MCHC: 33.9 g/dL (ref 30.0–36.0)
MCV: 96.6 fl (ref 78.0–100.0)
Monocytes Absolute: 0.4 10*3/uL (ref 0.1–1.0)
Monocytes Relative: 7.2 % (ref 3.0–12.0)
NEUTROS PCT: 68.9 % (ref 43.0–77.0)
Neutro Abs: 3.8 10*3/uL (ref 1.4–7.7)
PLATELETS: 193 10*3/uL (ref 150.0–400.0)
RBC: 3.75 Mil/uL — AB (ref 3.87–5.11)
RDW: 15.3 % (ref 11.5–15.5)
WBC: 5.5 10*3/uL (ref 4.0–10.5)

## 2014-05-08 LAB — POCT URINALYSIS DIPSTICK
Bilirubin, UA: NEGATIVE
GLUCOSE UA: NEGATIVE
LEUKOCYTES UA: NEGATIVE
Nitrite, UA: NEGATIVE
Urobilinogen, UA: 0.2
pH, UA: 5.5

## 2014-05-08 LAB — LIPID PANEL
CHOLESTEROL: 194 mg/dL (ref 0–200)
HDL: 83.3 mg/dL (ref >39.00)
LDL CALC: 100 mg/dL — AB (ref 0–99)
NonHDL: 110.7
TRIGLYCERIDES: 54 mg/dL (ref 0.0–149.0)
Total CHOL/HDL Ratio: 2
VLDL: 10.8 mg/dL (ref 0.0–40.0)

## 2014-05-08 LAB — TSH: TSH: 1.12 u[IU]/mL (ref 0.35–4.50)

## 2014-05-14 ENCOUNTER — Ambulatory Visit (INDEPENDENT_AMBULATORY_CARE_PROVIDER_SITE_OTHER): Payer: BC Managed Care – PPO | Admitting: Family Medicine

## 2014-05-14 ENCOUNTER — Encounter: Payer: Self-pay | Admitting: Family Medicine

## 2014-05-14 VITALS — BP 120/78 | HR 76 | Temp 98.5°F | Ht 66.0 in | Wt 172.0 lb

## 2014-05-14 DIAGNOSIS — R7401 Elevation of levels of liver transaminase levels: Secondary | ICD-10-CM

## 2014-05-14 DIAGNOSIS — R740 Nonspecific elevation of levels of transaminase and lactic acid dehydrogenase [LDH]: Secondary | ICD-10-CM

## 2014-05-14 DIAGNOSIS — R319 Hematuria, unspecified: Secondary | ICD-10-CM

## 2014-05-14 DIAGNOSIS — Z0000 Encounter for general adult medical examination without abnormal findings: Secondary | ICD-10-CM

## 2014-05-14 LAB — POCT URINALYSIS DIPSTICK
BILIRUBIN UA: NEGATIVE
Glucose, UA: NEGATIVE
Leukocytes, UA: NEGATIVE
Nitrite, UA: NEGATIVE
SPEC GRAV UA: 1.025
Urobilinogen, UA: 0.2
pH, UA: 5.5

## 2014-05-14 LAB — URINALYSIS, MICROSCOPIC ONLY

## 2014-05-14 NOTE — Progress Notes (Signed)
Subjective:    Patient ID: Monica Bailey, female    DOB: 01/06/1976, 38 y.o.   MRN: [id number]  HPI Seen for complete physical. Generally very healthy. She has history of reflux and she takes AcipHex for that. She's had some intermittent back pains and takes meloxicam. Nonsmoker. Regular exercise. She sees gynecologist regularly. Tetanus is up-to-date.  Past Medical History  Diagnosis Date  . 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 GERD 03/01/2009  . Headache(784.0) 03/01/2009  . Abdominal pain, left lower quadrant 06/09/2010  . Viral conjunctivitis     h/o left eye  . Hiatal hernia   . 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 IBS (irritable bowel syndrome)     worse in college   Past Surgical History  Procedure Laterality Date  . Abdominal hysterectomy  12/09  . Leep  12/08    10/98 and then 12/08 CIN II/III involving glandsand with positive endocervical margin  . 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 Her smoking use included Cigarettes. She has a 6 pack-year smoking history. She has never used smokeless tobacco. She reports that she drinks about 4 ounces of alcohol per week. She reports that she does not use illicit drugs. family history includes Alcohol abuse in her other; Arthritis in her other; Cancer in her other; Diabetes in her maternal grandmother; Heart disease in her maternal grandfather and other; Hypertension in her father, mother, and other; Osteoporosis in her maternal grandmother. Allergies  Allergen Reactions  . Penicillins       Review of Systems  Constitutional: Negative for fever, activity change, appetite change, fatigue and unexpected weight change.  HENT: Negative for ear pain, hearing loss, sore throat and trouble swallowing.   Eyes: Negative for visual disturbance.  Respiratory: Negative for cough and shortness of breath.     Cardiovascular: Negative for chest pain and palpitations.  Gastrointestinal: Negative for nausea, vomiting, abdominal pain, diarrhea, constipation and blood in stool.  Genitourinary: Negative for dysuria and hematuria.  Musculoskeletal: Positive for back pain. Negative for arthralgias and myalgias.  Skin: Negative for rash.  Neurological: Negative for dizziness, syncope and headaches.  Hematological: Negative for adenopathy.  Psychiatric/Behavioral: Negative for confusion and dysphoric mood.       Objective:   Physical Exam  Constitutional: She is oriented to person, place, and time. She appears well-developed and well-nourished.  HENT:  Head: Normocephalic and atraumatic.  Eyes: EOM are normal. Pupils are equal, round, and reactive to light.  Neck: Normal range of motion. Neck supple. No thyromegaly present.  Cardiovascular: Normal rate, regular rhythm and normal heart sounds.   No murmur heard. Pulmonary/Chest: Breath sounds normal. No respiratory distress. She has no wheezes. She has no rales.  Abdominal: Soft. Bowel sounds are normal. She exhibits no distension and no mass. There is no tenderness. There is no rebound and no guarding.  Genitourinary:  Per GYN  Musculoskeletal: Normal range of motion. She exhibits no edema.  Lymphadenopathy:    She has no cervical adenopathy.  Neurological: She is alert and oriented to person, place, and time. She displays normal reflexes. No cranial nerve deficit.  Skin: No rash noted.  Psychiatric: She has a normal mood and affect. Her behavior is normal. Judgment and thought content normal.          Assessment & Plan:  Complete  physical. Labs reviewed. Minimally elevated ALT in otherwise asymptomatic patient. Repeat hepatic panel 2 months. She has trace blood on repeat urine dipstick. Send urine Micro. Continue regular exercise habits

## 2014-05-14 NOTE — Progress Notes (Signed)
Pre visit review using our clinic review tool, if applicable. No additional management support is needed unless otherwise documented below in the visit note. 

## 2014-05-15 ENCOUNTER — Other Ambulatory Visit: Payer: Self-pay | Admitting: Family Medicine

## 2014-05-15 DIAGNOSIS — R319 Hematuria, unspecified: Secondary | ICD-10-CM

## 2014-06-05 ENCOUNTER — Other Ambulatory Visit (INDEPENDENT_AMBULATORY_CARE_PROVIDER_SITE_OTHER): Payer: BC Managed Care – PPO

## 2014-06-05 ENCOUNTER — Other Ambulatory Visit: Payer: BC Managed Care – PPO

## 2014-06-05 DIAGNOSIS — R319 Hematuria, unspecified: Secondary | ICD-10-CM

## 2014-06-05 DIAGNOSIS — R740 Nonspecific elevation of levels of transaminase and lactic acid dehydrogenase [LDH]: Secondary | ICD-10-CM

## 2014-06-05 DIAGNOSIS — R7401 Elevation of levels of liver transaminase levels: Secondary | ICD-10-CM

## 2014-06-05 LAB — URINALYSIS, MICROSCOPIC ONLY: RBC / HPF: NONE SEEN (ref 0–?)

## 2014-06-09 ENCOUNTER — Other Ambulatory Visit: Payer: Self-pay | Admitting: Family Medicine

## 2014-06-15 ENCOUNTER — Other Ambulatory Visit: Payer: BC Managed Care – PPO

## 2014-07-08 ENCOUNTER — Other Ambulatory Visit (INDEPENDENT_AMBULATORY_CARE_PROVIDER_SITE_OTHER): Payer: BC Managed Care – PPO

## 2014-07-08 DIAGNOSIS — R740 Nonspecific elevation of levels of transaminase and lactic acid dehydrogenase [LDH]: Principal | ICD-10-CM

## 2014-07-08 DIAGNOSIS — R7401 Elevation of levels of liver transaminase levels: Secondary | ICD-10-CM

## 2014-07-08 DIAGNOSIS — R7402 Elevation of levels of lactic acid dehydrogenase (LDH): Secondary | ICD-10-CM

## 2014-07-08 LAB — HEPATIC FUNCTION PANEL
ALBUMIN: 4.4 g/dL (ref 3.5–5.2)
ALT: 23 U/L (ref 0–35)
AST: 24 U/L (ref 0–37)
Alkaline Phosphatase: 29 U/L — ABNORMAL LOW (ref 39–117)
Bilirubin, Direct: 0.1 mg/dL (ref 0.0–0.3)
TOTAL PROTEIN: 7.8 g/dL (ref 6.0–8.3)
Total Bilirubin: 1.1 mg/dL (ref 0.2–1.2)

## 2014-08-25 ENCOUNTER — Other Ambulatory Visit: Payer: Self-pay | Admitting: Obstetrics & Gynecology

## 2014-08-26 NOTE — Telephone Encounter (Signed)
Last refilled: 04/16/14 #30/1 rfs by Dr. Hyacinth MeekerMiller Last AEX: 04/10/14 with Dr. Charissa BashMiller AEX Scheduled: 05/13/15 with Dr. Hyacinth MeekerMiller   Please Advise.

## 2014-08-27 NOTE — Telephone Encounter (Signed)
Rx has been Faxed. 

## 2014-09-28 ENCOUNTER — Encounter: Payer: Self-pay | Admitting: Family Medicine

## 2014-09-30 ENCOUNTER — Other Ambulatory Visit: Payer: Self-pay | Admitting: Family Medicine

## 2014-09-30 DIAGNOSIS — H02729 Madarosis of unspecified eye, unspecified eyelid and periocular area: Secondary | ICD-10-CM

## 2014-10-02 ENCOUNTER — Other Ambulatory Visit (INDEPENDENT_AMBULATORY_CARE_PROVIDER_SITE_OTHER): Payer: BC Managed Care – PPO

## 2014-10-02 DIAGNOSIS — H02729 Madarosis of unspecified eye, unspecified eyelid and periocular area: Secondary | ICD-10-CM

## 2014-10-05 ENCOUNTER — Other Ambulatory Visit: Payer: BC Managed Care – PPO

## 2014-10-05 DIAGNOSIS — E038 Other specified hypothyroidism: Secondary | ICD-10-CM

## 2014-10-06 LAB — TSH: TSH: 1.763 u[IU]/mL (ref 0.350–4.500)

## 2014-12-02 ENCOUNTER — Ambulatory Visit (INDEPENDENT_AMBULATORY_CARE_PROVIDER_SITE_OTHER): Payer: BLUE CROSS/BLUE SHIELD | Admitting: Family Medicine

## 2014-12-02 ENCOUNTER — Encounter: Payer: Self-pay | Admitting: Family Medicine

## 2014-12-02 VITALS — BP 116/80 | HR 70 | Temp 98.5°F | Wt 178.0 lb

## 2014-12-02 DIAGNOSIS — R1032 Left lower quadrant pain: Secondary | ICD-10-CM

## 2014-12-02 DIAGNOSIS — R1012 Left upper quadrant pain: Secondary | ICD-10-CM

## 2014-12-02 LAB — POCT URINALYSIS DIPSTICK
Bilirubin, UA: NEGATIVE
GLUCOSE UA: NEGATIVE
KETONES UA: NEGATIVE
LEUKOCYTES UA: NEGATIVE
NITRITE UA: NEGATIVE
PH UA: 7
Protein, UA: NEGATIVE
RBC UA: NEGATIVE
SPEC GRAV UA: 1.01
Urobilinogen, UA: 0.2

## 2014-12-02 LAB — CBC WITH DIFFERENTIAL/PLATELET
BASOS ABS: 0 10*3/uL (ref 0.0–0.1)
BASOS PCT: 0.4 % (ref 0.0–3.0)
EOS ABS: 0 10*3/uL (ref 0.0–0.7)
EOS PCT: 0.5 % (ref 0.0–5.0)
HCT: 36.8 % (ref 36.0–46.0)
HEMOGLOBIN: 12.7 g/dL (ref 12.0–15.0)
LYMPHS PCT: 33.2 % (ref 12.0–46.0)
Lymphs Abs: 1.6 10*3/uL (ref 0.7–4.0)
MCHC: 34.5 g/dL (ref 30.0–36.0)
MCV: 97 fl (ref 78.0–100.0)
MONOS PCT: 10 % (ref 3.0–12.0)
Monocytes Absolute: 0.5 10*3/uL (ref 0.1–1.0)
NEUTROS ABS: 2.7 10*3/uL (ref 1.4–7.7)
NEUTROS PCT: 55.9 % (ref 43.0–77.0)
Platelets: 206 10*3/uL (ref 150.0–400.0)
RBC: 3.79 Mil/uL — AB (ref 3.87–5.11)
RDW: 14.1 % (ref 11.5–15.5)
WBC: 4.8 10*3/uL (ref 4.0–10.5)

## 2014-12-02 LAB — LIPASE: Lipase: 27 U/L (ref 11.0–59.0)

## 2014-12-02 NOTE — Progress Notes (Signed)
Pre visit review using our clinic review tool, if applicable. No additional management support is needed unless otherwise documented below in the visit note. 

## 2014-12-02 NOTE — Patient Instructions (Signed)

## 2014-12-02 NOTE — Progress Notes (Signed)
Subjective:    Patient ID: Monica Bailey, female    DOB: 11/24/1975, 39 y.o.   MRN: [id number]  HPI Patient seen with abdominal pain. She has history of IBS and some chronic constipation but no real changes in bowel habits. No recent diarrhea. No bloody stools. No recent appetite or weight changes. She several months ago had some left lower quadrant bowel pain and more recently more mid and upper abdominal pain on the left side. No recent dietary changes. No exacerbating or alleviating factors. Denies a nausea or vomiting. She states that she was drinking excessive alcohol couple months ago but is gotten that under control. She does not describe any history of epigastric pains or right upper quadrant pain.  She sees gynecologist regularly. She's had previous CT abdomen and pelvis which she was living elsewhere which did show some sigmoid diverticulosis but no history of acute diverticulitis  Past Medical History  Diagnosis Date  . 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 GERD 03/01/2009  . Headache(784.0) 03/01/2009  . Abdominal pain, left lower quadrant 06/09/2010  . Viral conjunctivitis     h/o left eye  . Hiatal hernia   . 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 IBS (irritable bowel syndrome)     worse in college   Past Surgical History  Procedure Laterality Date  . Abdominal hysterectomy  12/09  . Leep  12/08    10/98 and then 12/08 CIN II/III involving glandsand with positive endocervical margin  . 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 Her smoking use included Cigarettes. She has a 6 pack-year smoking history. She has never used smokeless tobacco. She reports that she drinks about 4.0 oz of alcohol per week. She reports that she does not use illicit drugs. family history includes Alcohol abuse in her other; Arthritis in her other; Cancer in her other; Diabetes  in her maternal grandmother; Heart disease in her maternal grandfather and other; Hypertension in her father, mother, and other; Osteoporosis in her maternal grandmother. Allergies  Allergen Reactions  . Penicillins       Review of Systems  Constitutional: Negative for fever and chills.  Respiratory: Negative for shortness of breath.   Cardiovascular: Negative for chest pain.  Gastrointestinal: Positive for abdominal pain and constipation. Negative for nausea, vomiting, diarrhea, blood in stool and abdominal distention.  Genitourinary: Negative for dysuria, hematuria and flank pain.       Objective:   Physical Exam  Constitutional: She appears well-developed and well-nourished.  Neck: Neck supple.  Cardiovascular: Normal rate and regular rhythm.   Pulmonary/Chest: Effort normal and breath sounds normal. No respiratory distress. She has no wheezes. She has no rales.  Abdominal: Soft. Bowel sounds are normal. She exhibits no distension.  Minimally tender left lower quadrant and left upper quadrant. No splenomegaly. No masses. No guarding or rebound.  Skin: No rash noted.          Assessment & Plan:  Poorly localized left-sided abdominal pain. Patient does have history of diverticulosis by previous CT scan but no history of diverticulitis. Clinically, doubt acute diverticulitis as she's had symptoms off and on for several months. She does have constipation predominant IBS and symptoms may be related more to that. She had concerns regarding pancreatitis but we explained her current symptoms are not compatible with that. Will check  CBC, urinalysis, lipase. If all normal and symptoms persist with left lower quadrant pain consider GYN follow-up for further evaluation.  She does not have any red flags such as appetite or weight changes, bloody stools, fever, etc.

## 2014-12-03 ENCOUNTER — Ambulatory Visit (INDEPENDENT_AMBULATORY_CARE_PROVIDER_SITE_OTHER): Payer: BLUE CROSS/BLUE SHIELD

## 2014-12-03 ENCOUNTER — Other Ambulatory Visit: Payer: Self-pay | Admitting: Obstetrics & Gynecology

## 2014-12-03 ENCOUNTER — Telehealth: Payer: Self-pay | Admitting: Obstetrics & Gynecology

## 2014-12-03 ENCOUNTER — Ambulatory Visit (INDEPENDENT_AMBULATORY_CARE_PROVIDER_SITE_OTHER): Payer: BLUE CROSS/BLUE SHIELD | Admitting: Obstetrics & Gynecology

## 2014-12-03 VITALS — BP 120/84 | Ht 66.0 in | Wt 177.0 lb

## 2014-12-03 DIAGNOSIS — N8320 Unspecified ovarian cysts: Secondary | ICD-10-CM

## 2014-12-03 DIAGNOSIS — N83201 Unspecified ovarian cyst, right side: Secondary | ICD-10-CM

## 2014-12-03 DIAGNOSIS — R1032 Left lower quadrant pain: Secondary | ICD-10-CM

## 2014-12-03 DIAGNOSIS — N7011 Chronic salpingitis: Secondary | ICD-10-CM

## 2014-12-03 NOTE — Progress Notes (Signed)
39 y.o. G0 Married female here for a pelvic ultrasound due to on and off LLQ pain.  Pt reports this has been occuring over the last two weeks she has been experiencing off and on left lower quadrant pain.  Pt describes this as a "throbbing sensation".  Pt was seen by Dr. Caryl NeverBurchette who is evaluating the pt for gastroenterologic causes of her pain.  Pt feels it is gyn related.    Patient's last menstrual period was 10/17/2007.  Sexually active:  yes  Contraception: hysterectomy  FINDINGS: UTERUS: surgically absent.  Cuff with echogenic focu 5 x 6mm.  Possible Nabothian cyst. ADNEXA:   Left ovary 3.2 x 2.1 x 1.4cm with 19 x 8 x 7cm probable hydrosalpinx   Right ovary 2.7 x 2.0 x 2.5cm with 1.3cm collapsed corpus luteal cyst CUL DE SAC: no free fluid  Images reviewed with pt.  Small collapsed corpus luteal cyst on right noted.  Pain has been on her left side but there has been some on the right.  Pt really feels this isn't GI related and she would like to wait and see how this resolves as she does have a possible source of pain.  She is aware this is self limiting if related to the cyst.  Pt does not want to have any other testing done at this time.  Assessment:  Right collapsed corpus luteal cyst Left hydrosalpinx Possible nabothian cyst in vaginal cuff?  Plan: Pt will use anti-inflammatories and give update in a week.  ~15 minutes spent with patient >50% of time was in face to face discussion of above.

## 2014-12-03 NOTE — Telephone Encounter (Signed)
Pt states shes been having recurring pain on left lower abdomen. States she saw her GP - Dr Sander RadonBurchett and he recommended an ultrasound/follow up with Dr Hyacinth MeekerMiller.  Pt states she is currently experiencing this pain.  [facility]

## 2014-12-03 NOTE — Telephone Encounter (Signed)
Spoke with patient. Patient states that over the last two weeks she has been experiencing off and on left lower quadrant pain. States that the pain is a "throbbing sensation." "Sometimes it happens on my right side but mostly on my left and it has been getting more pronounced over the last couple of days." Patient was seen with Dr.Burchett yesterday who recommends that patient have a ultrasound with our office. "He thinks it may be related to my ovaries. I have has CT scans in the past year and he did some blood work to check for diverticulitis but he does not think it is related to that." Patient states she is not currently in pain but was this morning. Advised patient will need to speak with provider and return call to proceed with scheduling. Patient is agreeable.

## 2014-12-03 NOTE — Telephone Encounter (Signed)
Spoke with patient. Advised spoke with Dr.Miller who recommends that patient come in to be seen today for evaluation and PUS. Appointment scheduled for today at 2pm with 2:30pm consult with Dr.Miller. Patient is agreeable to date and time. Order placed will need precert.  Cc: Cathrine MusterSabrina Franklin  Routing to provider for final review. Patient agreeable to disposition. Will close encounter

## 2014-12-04 ENCOUNTER — Telehealth: Payer: Self-pay | Admitting: Family Medicine

## 2014-12-04 NOTE — Telephone Encounter (Signed)
Pt informed about lab results. ° °

## 2014-12-04 NOTE — Telephone Encounter (Signed)
Pt returned your call and would like a call back .. °

## 2014-12-06 ENCOUNTER — Encounter: Payer: Self-pay | Admitting: Obstetrics & Gynecology

## 2014-12-31 ENCOUNTER — Encounter: Payer: Self-pay | Admitting: Family Medicine

## 2015-01-01 ENCOUNTER — Other Ambulatory Visit: Payer: Self-pay | Admitting: Family Medicine

## 2015-01-01 DIAGNOSIS — R109 Unspecified abdominal pain: Secondary | ICD-10-CM

## 2015-01-05 ENCOUNTER — Encounter: Payer: Self-pay | Admitting: Internal Medicine

## 2015-01-06 ENCOUNTER — Other Ambulatory Visit: Payer: Self-pay | Admitting: Family Medicine

## 2015-01-26 ENCOUNTER — Encounter: Payer: Self-pay | Admitting: Obstetrics & Gynecology

## 2015-01-26 NOTE — Telephone Encounter (Signed)
Spoke with patient. Patient states that she has been experiencing some change with her hair and emotions. Has had TSH checked which was normal per patient. "My mother entered in to menopause at 4838 and I am about to be 39 so I did not know if this is something that could be causing it. I am also still having abdominal discomfort that I have seen Dr.Miller for before." Denies any current discomfort. Advised will need to be seen in office for further evaluation with Dr.Miller. Patient is agreeable. Requesting early morning or late afternoon appointment. Appointment scheduled for 4/25 at 1pm with Dr.Miller. Patient is agreeable to date and time. Patient will return call to move appointment up if symptoms change or worsen.  Routing to provider for final review. Patient agreeable to disposition. Will close encounter

## 2015-02-08 ENCOUNTER — Encounter: Payer: Self-pay | Admitting: Obstetrics & Gynecology

## 2015-02-08 ENCOUNTER — Ambulatory Visit (INDEPENDENT_AMBULATORY_CARE_PROVIDER_SITE_OTHER): Payer: BLUE CROSS/BLUE SHIELD | Admitting: Obstetrics & Gynecology

## 2015-02-08 VITALS — BP 118/78 | HR 60 | Resp 16 | Ht 66.0 in | Wt 174.0 lb

## 2015-02-08 DIAGNOSIS — N951 Menopausal and female climacteric states: Secondary | ICD-10-CM

## 2015-02-08 NOTE — Progress Notes (Signed)
Patient ID: Monica Bailey, female   DOB: 12/27/1975, 39 y.o.   MRN: [id number]  39 yo MWF here for discussion of possible menopausal symptoms.  Pt reports she has been losing her eyelashes more frequently.  Reports her mother went into menopause in her late 10530's.  Pt does have a younger sister, age 40.  Sister is pregnant.  Pt reports, now, she has been able to reflect some about the past year and thinks she may have some other symptoms.  She feels she is having hot flashes at night and that has changed during the last year.  Also, having some sleep disturbance and she would consider herself a really good sleeper.  Also reports some increased anxiety.  Started a new job last year so she has attributed some of this to the new job but it does seem "worse" than with other prior new jobs.  Reports she does like where she is working.  She also feels there is more anxiety, overall as well.    Has had a thyroid blood test in the last year.  This was normal.    D/W pt hormonal testing--FSh vs AMH.  As pt does not bleed due to hysterectomy, will proceed with AMH.  Assessment:  Menopausal symptoms  Plan:  AMH today.  Will proceed with additional recommendations after result is back.  D/W pt HRT, risks and benefits.  She would want to start estrogen if AMH is low.  ~15 minutes spent with patient >50% of time was in face to face discussion of above.

## 2015-02-11 LAB — ANTI MULLERIAN HORMONE: AMH ASSESSR: 1.07 ng/mL

## 2015-02-16 ENCOUNTER — Telehealth: Payer: Self-pay | Admitting: Obstetrics & Gynecology

## 2015-02-16 NOTE — Telephone Encounter (Signed)
Spoke with patient. Advised of message as seen below from Dr.Miller. Patient is agreeable and verbalizes understanding. Patient would like to proceed with additional testing but would like to know what testing would be performed before scheduling. Advised will speak with Dr.Miller regarding further testing recommendations and return call. Patient is agreeable.   Notes Recorded by [name], MD on 02/16/2015 at 6:39 AM Please inform pt that her AMH is not in menopausal range. It is appropriate for a woman near 2740. This does not explain any of her symptoms. I can have her return for additional blood work if she desires.

## 2015-02-16 NOTE — Telephone Encounter (Signed)
Patient calling to check on the status of her results. °

## 2015-02-26 ENCOUNTER — Other Ambulatory Visit: Payer: Self-pay | Admitting: Family Medicine

## 2015-02-28 NOTE — Telephone Encounter (Signed)
Her three issues when she came in to talk were--eyelash loss, increased hot flashes at night with associated sleep disturbance, and increased anxiety.  I reviewed all the labs within the last 9 months and they include normal CMP (with normal AST and ALT), normal TSH, normal hormonal testing.  There are several additional tests that can be done on a 24 hour urine analysis to assess for adrenal issues.    Another approach would be to start an anti-anxiety medication as this could cause sleep issues and hair loss.  A consideration would just be a low dose Prozac like a 10mg  dosage and see if that helps.  Would need to take for about 4 weeks to start with before deciding to increase or to stop/change medication.

## 2015-03-01 MED ORDER — FLUOXETINE HCL 10 MG PO CAPS: 10.0000 mg | ORAL_CAPSULE | Freq: Every day | ORAL | Status: DC

## 2015-03-01 NOTE — Telephone Encounter (Signed)
Left message to call Nicholaos Schippers at [phone]. 

## 2015-03-01 NOTE — Telephone Encounter (Signed)
Spoke with patient. Advised of message as seen below from Dr.Miller. Patient is agreeable and verbalizes understanding. Patient would like to start on Prozac at this time to see if this relieves symptoms. Aware will take about four weeks for medication to get into her system and relieve symptoms. Patient is agreeable. Advised to return call if no relief after the four week time period. Patient is agreeable. Rx for Prozac 10mg  #30 1RF sent to pharmacy on file.  Routing to provider for final review. Patient agreeable to disposition. Patient aware provider will review message and nurse will return call with any additional instructions or change of disposition. Will close encounter.

## 2015-03-02 ENCOUNTER — Encounter: Payer: Self-pay | Admitting: Internal Medicine

## 2015-03-02 ENCOUNTER — Ambulatory Visit (INDEPENDENT_AMBULATORY_CARE_PROVIDER_SITE_OTHER): Payer: BLUE CROSS/BLUE SHIELD | Admitting: Internal Medicine

## 2015-03-02 VITALS — BP 122/90 | HR 64 | Ht 66.0 in | Wt 175.0 lb

## 2015-03-02 DIAGNOSIS — R1032 Left lower quadrant pain: Principal | ICD-10-CM

## 2015-03-02 DIAGNOSIS — R10814 Left lower quadrant abdominal tenderness: Secondary | ICD-10-CM

## 2015-03-02 DIAGNOSIS — G8929 Other chronic pain: Secondary | ICD-10-CM | POA: Diagnosis not present

## 2015-03-02 DIAGNOSIS — R1031 Right lower quadrant pain: Secondary | ICD-10-CM | POA: Diagnosis not present

## 2015-03-02 NOTE — Progress Notes (Signed)
Referred by: Kristian CoveyBurchette, Bruce W, MD  Subjective:    Patient ID: Monica MechBrenna L Firmin, female    DOB: 05/05/1976, 39 y.o.   MRN: [id number] Chief complaint: Left lower quadrant pain HPI The patient is a very nice married 39 year old white woman with a chronic intermittent left lower quadrant pain. It dates back to at least 2009. CT scanning then demonstrated some diverticulosis but no other abnormalities that would explain the pain. She had a hysterectomy. The pain has persisted, I don't think the hysterectomy was for that pain however. She describes a pulling sensation, it often can occur in the mornings, it is associated with low back pain. She has low back difficulty and left buttock pain radiates into the left thigh. She has seen orthopedics or a physiatrist and has had an epidural injection that didn't seem to help. A month or so of lower Did not provide relief. It is not really associated with bowel movements, though she does suffer from some occasional constipation. She has some bloating problems at times. She does yoga and some sit ups and stretching and things like that with that and says she feels better after that. There is no rectal bleeding. She [place] of the as an and pelvis that showed diverticulosis in the sigmoid colon but no diverticulitis, she has also had an upper endoscopy. It was negative. I reviewed those records. She has recently seen gynecology and had a pelvic ultrasound that was negative and she has had a negative pelvic ultrasound prior to that. GI review of systems is otherwise negative.  She does have concern because her mother had diverticulitis which presented with an acute perforation requiring bowel resection and she is concerned that she could have something similar. Medications, allergies, past medical history, past surgical history, family history and social history are reviewed and updated in the EMR.  Review of  Systems As per history of present illness. Allergy problems as well.    Objective:   Physical Exam @BP  122/90 mmHg  Pulse 64  Ht 5\' 6"  (1.676 m)  Wt 175 lb (79.379 kg)  BMI 28.26 kg/m2  LMP 10/17/2007@  General:  Well-developed, well-nourished and in no acute distress Eyes:  anicteric. Lungs: Clear to auscultation bilaterally. Heart:  S1S2, no rubs, murmurs, gallops. Abdomen:  soft, mildly tender to deep palpation in the left lower quadrant, no hepatosplenomegaly, hernia, or mass and BS+. There is no worsening of pain, and affect is better with muscle tension. No change in the pain or problems with manipulation of the hips with internal/external rotation or straight leg raise on either side. Back:  Spine is nontender she does have some tenderness over the left buttock Extremities:   no edema, cyanosis or clubbing Skin   no rash,  Neuro:  A&O x 3.  Psych:  appropriate mood and  Affect.   Data Reviewed: As per history of present illness. Also reviewed primary care notes gynecology notes and labs in the computer from 2016.    Assessment & Plan:   1. Chronic LLQ pain   2. LLQ abdominal tenderness     Given the workup she has had I don't think further investigation with imaging or endoscopic evaluation is warranted. It may be referred pain related to her back problems and a radicular symptom. She has follow-up with her physician regarding that and I would follow through with that. If that is unhelpful I am happy to see her  back again though would probably try empiric therapy. She has not responded to a month or more of nonsteroidal or epidural injection. The chronicity points towards something benign however, and though it is unfortunately a bother I don't think it's anything dangerous.  I believe she was adequately reassured today.  I appreciate the opportunity to care for this patient.   CC: Kristian CoveyBURCHETTE,BRUCE W, MD

## 2015-03-02 NOTE — Patient Instructions (Signed)
  Follow up with Dr Gessner as needed.   I appreciate the opportunity to care for you. Carl Gessner, MD, FACG 

## 2015-04-06 ENCOUNTER — Telehealth: Payer: Self-pay | Admitting: Internal Medicine

## 2015-04-06 NOTE — Telephone Encounter (Signed)
Patient has continued abdominal pain and bloating.  She feels this is all related to her bowels.  She has not gone back to her primary care, but has an appt this Friday.  Your last note said that you did not feel she needed any image or endoscopic procedures, but you might treat empirically.  Please advise

## 2015-04-07 MED ORDER — VSL#3 PO CAPS: 2.0000 | ORAL_CAPSULE | Freq: Every day | ORAL | Status: DC

## 2015-04-07 NOTE — Telephone Encounter (Signed)
Ask her to take VSL # 3  - 2 capsules daily x 1 month - can give her an RX to get it at [facility] if she is not a member and it is available at [facility] outpatient pharmacy  She should also set up a f/u with me in about 6-8 weeks so we can go over things and make sure she is better and if not decide what to do.

## 2015-04-07 NOTE — Telephone Encounter (Signed)
Patient notified VSL # 3 sent to [facility]  She will try to come by and pick up the coupon.  I will put it out front for her She is scheduled for follow up for 06/11/15 10:45

## 2015-05-13 ENCOUNTER — Encounter: Payer: Self-pay | Admitting: Obstetrics & Gynecology

## 2015-05-13 ENCOUNTER — Ambulatory Visit (INDEPENDENT_AMBULATORY_CARE_PROVIDER_SITE_OTHER): Payer: BLUE CROSS/BLUE SHIELD | Admitting: Obstetrics & Gynecology

## 2015-05-13 VITALS — BP 120/78 | HR 66 | Resp 14 | Ht 66.75 in | Wt 170.8 lb

## 2015-05-13 DIAGNOSIS — Z8379 Family history of other diseases of the digestive system: Secondary | ICD-10-CM | POA: Diagnosis not present

## 2015-05-13 DIAGNOSIS — Z124 Encounter for screening for malignant neoplasm of cervix: Secondary | ICD-10-CM

## 2015-05-13 DIAGNOSIS — R1012 Left upper quadrant pain: Secondary | ICD-10-CM | POA: Diagnosis not present

## 2015-05-13 DIAGNOSIS — Z01419 Encounter for gynecological examination (general) (routine) without abnormal findings: Secondary | ICD-10-CM | POA: Diagnosis not present

## 2015-05-13 DIAGNOSIS — Z0000 Encounter for general adult medical examination without abnormal findings: Secondary | ICD-10-CM

## 2015-05-13 LAB — POCT URINALYSIS DIPSTICK
Bilirubin, UA: NEGATIVE
Blood, UA: NEGATIVE
Glucose, UA: NEGATIVE
KETONES UA: NEGATIVE
Leukocytes, UA: NEGATIVE
Nitrite, UA: NEGATIVE
PH UA: 5
Protein, UA: NEGATIVE
UROBILINOGEN UA: NEGATIVE

## 2015-05-13 MED ORDER — NORETHIN ACE-ETH ESTRAD-FE 1-20 MG-MCG PO TABS: 1.0000 | ORAL_TABLET | Freq: Every day | ORAL | Status: DC

## 2015-05-13 NOTE — Progress Notes (Signed)
39 y.o. G0P0000 MarriedCaucasianF here for annual exam.  H/o recurrent ovarian cysts which seem to cause pelvic pain when they occur.  Typically when ultrasound is done and pt is having pain, a cyst is present.  Pain does go away.  Has been on OCPs in past for this which really helps.  In addition, pt having LUQ pain that has been present for about a year.  Has seen Dr. Leone Payor and was advised to not have any testing.  Pt reports she does feel it worse with BM.  No blood in her stool.  Has been on a pro-biotic about a month and this hasn't done anything.    Both pt's parents have diverticulosis/itis. Diverticulosis noted on CT in 2/14.  This CT is part of medical record under media tab.  Pt interested in second opinion regarding having a colonoscopy.  Patient's last menstrual period was 10/17/2007.          Sexually active: Yes.    The current method of family planning is none. none.    Exercising: Yes.    run, yoga Smoker:  no  Health Maintenance: Pap:  04/10/14 wnl History of abnormal Pap:  Yes. 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 MMG:  08/01/2011 normal Colonoscopy:  n/a BMD:   n/a TDaP:  2011 Screening Labs: PCP, Hb today: PCP, Urine today: Neg.   reports that she quit smoking about 18 years ago. Her smoking use included Cigarettes. She has a 6 pack-year smoking history. She has never used smokeless tobacco. She reports that she drinks about 4.0 oz of alcohol per week. She reports that she does not use illicit drugs.  Past Medical History  Diagnosis Date  . 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 GERD 03/01/2009  . Headache(784.0) 03/01/2009  . Abdominal pain, left lower quadrant 06/09/2010  . Viral conjunctivitis     h/o left eye  . Hiatal hernia   . 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 IBS (irritable bowel syndrome)     worse in college    Past Surgical History  Procedure Laterality Date  . Abdominal hysterectomy  12/09   . Leep  12/08    10/98 and then 12/08 CIN II/III involving glandsand with positive endocervical margin  . Ckc  6/09    CIN I/III with negative margins    Current Outpatient Prescriptions  Medication Sig Dispense Refill  . diphenhydrAMINE (BENADRYL) 25 mg capsule Take 25 mg by mouth every 6 (six) hours as needed.      Marland Kitchen FLUoxetine (PROZAC) 10 MG capsule Take 1 capsule (10 mg total) by mouth daily. 30 capsule 1  . IRON PO Take by mouth daily.    . Naproxen Sodium 220 MG CAPS Take by mouth as needed.    . Probiotic Product (VSL#3) CAPS Take 2 capsules by mouth daily. 60 capsule 0  . RABEprazole (ACIPHEX) 20 MG tablet TAKE 1 TABLET (20 MG TOTAL) BY MOUTH DAILY. GENERIC NECESSARY 30 tablet 5   No current facility-administered medications for this visit.    Family History  Problem Relation Age of Onset  . Arthritis Other   . Alcohol abuse Other   . Cancer Other     lung  . Heart disease Other   . Hypertension Other     maternal side  . Diabetes Maternal Grandmother   . Osteoporosis Maternal Grandmother   . Hypertension Mother   . Colon polyps Mother   .  Hypertension Father   . Heart disease Maternal Grandfather   . Diverticulosis Mother   . Diverticulosis Father     ROS:  Pertinent items are noted in HPI.  Otherwise, a comprehensive ROS was negative.  Exam:    General appearance: alert, cooperative and appears stated age Head: Normocephalic, without obvious abnormality, atraumatic Neck: no adenopathy, supple, symmetrical, trachea midline and thyroid normal to inspection and palpation Lungs: clear to auscultation bilaterally Breasts: normal appearance, no masses or tenderness Heart: regular rate and rhythm Abdomen: soft, non-tender; bowel sounds normal; no masses,  no organomegaly Extremities: extremities normal, atraumatic, no cyanosis or edema Skin: Skin color, texture, turgor normal. No rashes or lesions Lymph nodes: Cervical, supraclavicular, and axillary nodes  normal. No abnormal inguinal nodes palpated Neurologic: Grossly normal   Pelvic: External genitalia:  no lesions              Urethra:  normal appearing urethra with no masses, tenderness or lesions              Bartholins and Skenes: normal                 Vagina: normal appearing vagina with normal color and discharge, no lesions              Cervix: absent              Pap taken: Yes.   Bimanual Exam:  Uterus:  uterus absent              Adnexa: no mass, fullness, tenderness               Rectovaginal: Confirms               Anus:  normal sphincter tone, no lesions  Chaperone was present for exam.  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 Information provided. pap smear only obtained today. Referral to Dr. Loreta Ave for possible colonoscopy return annually or prn

## 2015-05-17 ENCOUNTER — Telehealth: Payer: Self-pay | Admitting: Obstetrics & Gynecology

## 2015-05-17 DIAGNOSIS — R1012 Left upper quadrant pain: Secondary | ICD-10-CM

## 2015-05-17 DIAGNOSIS — Z8379 Family history of other diseases of the digestive system: Secondary | ICD-10-CM

## 2015-05-17 NOTE — Telephone Encounter (Signed)
Patient was calling regarding a referral appointment that was made for her.

## 2015-05-17 NOTE — Telephone Encounter (Signed)
Spoke with patient. Patient states that Dr.Miller was going to place a referral to Dr.Mann for a second opinion of GI symptoms. States referral was placed to [facility] where she is already a patient. Patient is requesting new referral be placed. Advised I will place new referral to Dr.Mann and our referrals coordinator in the office or Dr.Mann's office will be in direct contact with her regarding scheduling. Patient is agreeable.  Cc: Monica Bailey  Routing to provider for final review. Patient agreeable to disposition. Will close encounter.   Patient aware provider will review message and nurse will return call if any additional advice or change of disposition.

## 2015-05-18 ENCOUNTER — Encounter: Payer: Self-pay | Admitting: Obstetrics & Gynecology

## 2015-05-21 LAB — IPS PAP TEST WITH REFLEX TO HPV

## 2015-06-10 ENCOUNTER — Other Ambulatory Visit: Payer: Self-pay | Admitting: Gastroenterology

## 2015-06-10 DIAGNOSIS — R1032 Left lower quadrant pain: Secondary | ICD-10-CM

## 2015-06-11 ENCOUNTER — Ambulatory Visit
Admission: RE | Admit: 2015-06-11 | Discharge: 2015-06-11 | Disposition: A | Discharge status: Home / Self Care | Payer: BLUE CROSS/BLUE SHIELD | Source: Ambulatory Visit | Attending: Gastroenterology | Admitting: Gastroenterology

## 2015-06-11 ENCOUNTER — Ambulatory Visit: Payer: BLUE CROSS/BLUE SHIELD | Admitting: Internal Medicine

## 2015-06-11 DIAGNOSIS — R1032 Left lower quadrant pain: Secondary | ICD-10-CM

## 2015-06-11 MED ORDER — IOPAMIDOL (ISOVUE-300) INJECTION 61%
100.0000 mL | Freq: Once | INTRAVENOUS | Status: AC | PRN
  Administered 2015-06-11: 100 mL via INTRAVENOUS

## 2015-06-14 ENCOUNTER — Other Ambulatory Visit: Payer: BLUE CROSS/BLUE SHIELD

## 2015-06-15 ENCOUNTER — Other Ambulatory Visit: Payer: BLUE CROSS/BLUE SHIELD

## 2015-08-02 ENCOUNTER — Encounter: Payer: Self-pay | Admitting: Obstetrics & Gynecology

## 2015-08-02 ENCOUNTER — Telehealth: Payer: Self-pay

## 2015-08-02 NOTE — Telephone Encounter (Signed)
Per review of OV note from aex on 05/13/2015 patient was given Lo-Estrin Fe 1-20 #3 4RF. Spoke with patient. Advised refills were sent for one year until next aex is due. Patient is agreeable and verbalizes understanding.   Routing to provider for final review. Patient agreeable to disposition. Will close encounter.

## 2015-08-02 NOTE — Telephone Encounter (Signed)
Non-Urgent Medical Question     From   Monica Bailey    To   [name], MD    Sent   08/02/2015 8:36 AM       Hello,   At my yearly checkup a couple of months ago, Dr Hyacinth MeekerMiller prescribed 3 months of birth control pills to see if it helped with cyst pain. It has so I am requesting that the prescription be extended.   If you have any questions I can be reached at [phone].   Thank you,  Monica Bailey

## 2015-08-02 NOTE — Telephone Encounter (Signed)
Per review of OV note from aex on 05/13/2015 patient was given Lo-Estrin Fe 1-20 #3 4RF. Spoke with patient. Advised refills were sent for one year until next aex is due. Patient is agreeable and verbalizes understanding.

## 2015-10-19 LAB — BASIC METABOLIC PANEL
BUN: 6 mg/dL (ref 4–21)
Creatinine: 0.7 mg/dL (ref 0.5–1.1)
Glucose: 90 mg/dL
Potassium: 4 mmol/L (ref 3.4–5.3)
SODIUM: 136 mmol/L — AB (ref 137–147)

## 2015-10-22 ENCOUNTER — Encounter: Payer: Self-pay | Admitting: Family Medicine

## 2015-11-05 ENCOUNTER — Other Ambulatory Visit (INDEPENDENT_AMBULATORY_CARE_PROVIDER_SITE_OTHER): Payer: BLUE CROSS/BLUE SHIELD

## 2015-11-05 DIAGNOSIS — Z0000 Encounter for general adult medical examination without abnormal findings: Secondary | ICD-10-CM

## 2015-11-05 LAB — LIPID PANEL
Cholesterol: 185 mg/dL (ref 0–200)
HDL: 83.8 mg/dL (ref >39.00)
LDL Cholesterol: 86 mg/dL (ref 0–99)
NonHDL: 101.35
Total CHOL/HDL Ratio: 2
Triglycerides: 76 mg/dL (ref 0.0–149.0)
VLDL: 15.2 mg/dL (ref 0.0–40.0)

## 2015-11-05 LAB — CBC WITH DIFFERENTIAL/PLATELET
Basophils Absolute: 0 10*3/uL (ref 0.0–0.1)
Basophils Relative: 0.3 % (ref 0.0–3.0)
Eosinophils Absolute: 0 10*3/uL (ref 0.0–0.7)
Eosinophils Relative: 0.4 % (ref 0.0–5.0)
HCT: 37 % (ref 36.0–46.0)
Hemoglobin: 12.3 g/dL (ref 12.0–15.0)
Lymphocytes Relative: 18.6 % (ref 12.0–46.0)
Lymphs Abs: 1.2 10*3/uL (ref 0.7–4.0)
MCHC: 33.3 g/dL (ref 30.0–36.0)
MCV: 98.8 fl (ref 78.0–100.0)
Monocytes Absolute: 0.5 10*3/uL (ref 0.1–1.0)
Monocytes Relative: 7.5 % (ref 3.0–12.0)
Neutro Abs: 4.7 10*3/uL (ref 1.4–7.7)
Neutrophils Relative %: 73.2 % (ref 43.0–77.0)
Platelets: 221 10*3/uL (ref 150.0–400.0)
RBC: 3.75 Mil/uL — ABNORMAL LOW (ref 3.87–5.11)
RDW: 12.8 % (ref 11.5–15.5)
WBC: 6.4 10*3/uL (ref 4.0–10.5)

## 2015-11-05 LAB — BASIC METABOLIC PANEL
BUN: 11 mg/dL (ref 6–23)
CALCIUM: 9.2 mg/dL (ref 8.4–10.5)
CHLORIDE: 103 meq/L (ref 96–112)
CO2: 25 meq/L (ref 19–32)
CREATININE: 0.68 mg/dL (ref 0.40–1.20)
GFR: 101.49 mL/min (ref >60.00)
Glucose, Bld: 81 mg/dL (ref 70–99)
Potassium: 4.3 mEq/L (ref 3.5–5.1)
SODIUM: 137 meq/L (ref 135–145)

## 2015-11-05 LAB — HEPATIC FUNCTION PANEL
ALT: 22 U/L (ref 0–35)
AST: 23 U/L (ref 0–37)
Albumin: 4.2 g/dL (ref 3.5–5.2)
Alkaline Phosphatase: 26 U/L — ABNORMAL LOW (ref 39–117)
Bilirubin, Direct: 0.1 mg/dL (ref 0.0–0.3)
Total Bilirubin: 0.7 mg/dL (ref 0.2–1.2)
Total Protein: 7.6 g/dL (ref 6.0–8.3)

## 2015-11-05 LAB — TSH: TSH: 1.15 u[IU]/mL (ref 0.35–4.50)

## 2015-11-10 ENCOUNTER — Encounter: Payer: Self-pay | Admitting: Family Medicine

## 2015-11-10 ENCOUNTER — Ambulatory Visit (INDEPENDENT_AMBULATORY_CARE_PROVIDER_SITE_OTHER): Payer: BLUE CROSS/BLUE SHIELD | Admitting: Family Medicine

## 2015-11-10 VITALS — BP 128/88 | HR 84 | Temp 98.7°F | Ht 66.75 in | Wt 165.0 lb

## 2015-11-10 DIAGNOSIS — Z0000 Encounter for general adult medical examination without abnormal findings: Secondary | ICD-10-CM | POA: Diagnosis not present

## 2015-11-10 NOTE — Progress Notes (Signed)
Pre visit review using our clinic review tool, if applicable. No additional management support is needed unless otherwise documented below in the visit note. 

## 2015-11-10 NOTE — Progress Notes (Signed)
Subjective:    Patient ID: Monica Bailey, female    DOB: June 28, 1976, 40 y.o.   MRN: [id number]  HPI Patient here for physical exam. She sees gynecologist yearly. She had Pap smear last summer reportedly normal. Mammogram about 3-4 years ago. She has long history of IBS and currently takes probiotic and symptoms are stable. No consistent exercise.  Nonsmoker. Family history reviewed with no changes. She's had some chronic intermittent abdominal pain and has had extensive workup. CT scan abdomen and pelvis last year showed question of diverticulitis. No recent flareups.  Past Medical History  Diagnosis Date  . 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 GERD 03/01/2009  . Headache(784.0) 03/01/2009  . Abdominal pain, left lower quadrant 06/09/2010  . Viral conjunctivitis     h/o left eye  . Hiatal hernia   . 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 IBS (irritable bowel syndrome)     worse in college   Past Surgical History  Procedure Laterality Date  . Abdominal hysterectomy  12/09  . Leep  12/08    10/98 and then 12/08 CIN II/III involving glandsand with positive endocervical margin  . Ckc  6/09    CIN I/III with negative margins    reports that she quit smoking about 18 years ago. Her smoking use included Cigarettes. She has a 6 pack-year smoking history. She has never used smokeless tobacco. She reports that she drinks about 12.0 - 13.2 oz of alcohol per week. She reports that she does not use illicit drugs. family history includes Alcohol abuse in her other; Arthritis in her other; Cancer in her other; Colon polyps in her mother; Diabetes in her maternal grandmother; Diverticulosis in her father and mother; Heart disease in her maternal grandfather and other; Hypertension in her father, mother, and other; Osteoporosis in her maternal grandmother. Allergies  Allergen Reactions  . Penicillins       Review of Systems    Constitutional: Negative for fever, activity change, appetite change, fatigue and unexpected weight change.  HENT: Negative for ear pain, hearing loss, sore throat and trouble swallowing.   Eyes: Negative for visual disturbance.  Respiratory: Negative for cough and shortness of breath.   Cardiovascular: Negative for chest pain and palpitations.  Gastrointestinal: Negative for diarrhea, constipation and blood in stool.  Genitourinary: Negative for dysuria and hematuria.  Musculoskeletal: Negative for myalgias, back pain and arthralgias.  Skin: Negative for rash.  Neurological: Negative for dizziness, syncope and headaches.  Hematological: Negative for adenopathy.  Psychiatric/Behavioral: Negative for confusion and dysphoric mood.       Objective:   Physical Exam  Constitutional: She is oriented to person, place, and time. She appears well-developed and well-nourished.  HENT:  Head: Normocephalic and atraumatic.  Eyes: EOM are normal. Pupils are equal, round, and reactive to light.  Neck: Normal range of motion. Neck supple. No thyromegaly present.  Cardiovascular: Normal rate, regular rhythm and normal heart sounds.   No murmur heard. Pulmonary/Chest: Breath sounds normal. No respiratory distress. She has no wheezes. She has no rales.  Abdominal: Soft. Bowel sounds are normal. She exhibits no distension and no mass. There is no tenderness. There is no rebound and no guarding.  Musculoskeletal: Normal range of motion. She exhibits no edema.  Lymphadenopathy:    She has no cervical adenopathy.  Neurological: She is alert and oriented to person, place, and time. She displays normal reflexes. No cranial nerve  deficit.  Skin: No rash noted.  Psychiatric: She has a normal mood and affect. Her behavior is normal. Judgment and thought content normal.          Assessment & Plan:  Physical exam. Patient will continue with GYN follow-up. Labs reviewed with no major concerns. Blood  pressure was borderline high diastolic. We gave her information on DASH diet. Start more consistent exercise. Monitor blood pressure closely and be in touch if consistently greater than 140/90.

## 2015-11-10 NOTE — Patient Instructions (Signed)
DASH Eating Plan DASH stands for "Dietary Approaches to Stop Hypertension." The DASH eating plan is a healthy eating plan that has been shown to reduce high blood pressure (hypertension). Additional health benefits may include reducing the risk of type 2 diabetes mellitus, heart disease, and stroke. The DASH eating plan may also help with weight loss. WHAT DO I NEED TO KNOW ABOUT THE DASH EATING PLAN? For the DASH eating plan, you will follow these general guidelines:  Choose foods with a percent daily value for sodium of less than 5% (as listed on the food label).  Use salt-free seasonings or herbs instead of table salt or sea salt.  Check with your health care provider or pharmacist before using salt substitutes.  Eat lower-sodium products, often labeled as "lower sodium" or "no salt added."  Eat fresh foods.  Eat more vegetables, fruits, and low-fat dairy products.  Choose whole grains. Look for the word "whole" as the first word in the ingredient list.  Choose fish and skinless chicken or turkey more often than red meat. Limit fish, poultry, and meat to 6 oz (170 g) each day.  Limit sweets, desserts, sugars, and sugary drinks.  Choose heart-healthy fats.  Limit cheese to 1 oz (28 g) per day.  Eat more home-cooked food and less restaurant, buffet, and fast food.  Limit fried foods.  Cook foods using methods other than frying.  Limit canned vegetables. If you do use them, rinse them well to decrease the sodium.  When eating at a restaurant, ask that your food be prepared with less salt, or no salt if possible. WHAT FOODS CAN I EAT? Seek help from a dietitian for individual calorie needs. Grains Whole grain or whole wheat bread. Brown rice. Whole grain or whole wheat pasta. Quinoa, bulgur, and whole grain cereals. Low-sodium cereals. Corn or whole wheat flour tortillas. Whole grain cornbread. Whole grain crackers. Low-sodium crackers. Vegetables Fresh or frozen vegetables  (raw, steamed, roasted, or grilled). Low-sodium or reduced-sodium tomato and vegetable juices. Low-sodium or reduced-sodium tomato sauce and paste. Low-sodium or reduced-sodium canned vegetables.  Fruits All fresh, canned (in natural juice), or frozen fruits. Meat and Other Protein Products Ground beef (85% or leaner), grass-fed beef, or beef trimmed of fat. Skinless chicken or turkey. Ground chicken or turkey. Pork trimmed of fat. All fish and seafood. Eggs. Dried beans, peas, or lentils. Unsalted nuts and seeds. Unsalted canned beans. Dairy Low-fat dairy products, such as skim or 1% milk, 2% or reduced-fat cheeses, low-fat ricotta or cottage cheese, or plain low-fat yogurt. Low-sodium or reduced-sodium cheeses. Fats and Oils Tub margarines without trans fats. Light or reduced-fat mayonnaise and salad dressings (reduced sodium). Avocado. Safflower, olive, or canola oils. Natural peanut or almond butter. Other Unsalted popcorn and pretzels. The items listed above may not be a complete list of recommended foods or beverages. Contact your dietitian for more options. WHAT FOODS ARE NOT RECOMMENDED? Grains White bread. White pasta. White rice. Refined cornbread. Bagels and croissants. Crackers that contain trans fat. Vegetables Creamed or fried vegetables. Vegetables in a cheese sauce. Regular canned vegetables. Regular canned tomato sauce and paste. Regular tomato and vegetable juices. Fruits Dried fruits. Canned fruit in light or heavy syrup. Fruit juice. Meat and Other Protein Products Fatty cuts of meat. Ribs, chicken wings, bacon, sausage, bologna, salami, chitterlings, fatback, hot dogs, bratwurst, and packaged luncheon meats. Salted nuts and seeds. Canned beans with salt. Dairy Whole or 2% milk, cream, half-and-half, and cream cheese. Whole-fat or sweetened yogurt. Full-fat   cheeses or blue cheese. Nondairy creamers and whipped toppings. Processed cheese, cheese spreads, or cheese  curds. Condiments Onion and garlic salt, seasoned salt, table salt, and sea salt. Canned and packaged gravies. Worcestershire sauce. Tartar sauce. Barbecue sauce. Teriyaki sauce. Soy sauce, including reduced sodium. Steak sauce. Fish sauce. Oyster sauce. Cocktail sauce. Horseradish. Ketchup and mustard. Meat flavorings and tenderizers. Bouillon cubes. Hot sauce. Tabasco sauce. Marinades. Taco seasonings. Relishes. Fats and Oils Butter, stick margarine, lard, shortening, ghee, and bacon fat. Coconut, palm kernel, or palm oils. Regular salad dressings. Other Pickles and olives. Salted popcorn and pretzels. The items listed above may not be a complete list of foods and beverages to avoid. Contact your dietitian for more information. WHERE CAN I FIND MORE INFORMATION? National Heart, Lung, and Blood Institute: www.nhlbi.nih.gov/health/health-topics/topics/dash/   This information is not intended to replace advice given to you by your health care provider. Make sure you discuss any questions you have with your health care provider.   Document Released: 09/21/2011 Document Revised: 10/23/2014 Document Reviewed: 08/06/2013 Elsevier Interactive Patient Education 2016 [employer] Inc.  Monitor blood pressure and be in touch if consistently > 140/90.    

## 2016-01-04 ENCOUNTER — Other Ambulatory Visit: Payer: Self-pay | Admitting: Family Medicine

## 2016-01-27 ENCOUNTER — Encounter: Payer: Self-pay | Admitting: Family Medicine

## 2016-02-02 ENCOUNTER — Ambulatory Visit (INDEPENDENT_AMBULATORY_CARE_PROVIDER_SITE_OTHER): Payer: BLUE CROSS/BLUE SHIELD | Admitting: Family Medicine

## 2016-02-02 VITALS — BP 110/88 | HR 78 | Temp 98.7°F | Ht 66.75 in | Wt 167.6 lb

## 2016-02-02 DIAGNOSIS — R1032 Left lower quadrant pain: Secondary | ICD-10-CM

## 2016-02-02 NOTE — Progress Notes (Signed)
   Subjective:    Patient ID: Monica Bailey, female    DOB: 10/21/1975, 40 y.o.   MRN: [id number]  HPI Patient has long history of intermittent abdominal pain. This past Sunday developed some pain left lower quadrant abdomen. Achy pain of moderate severity. No exacerbating factors. No alleviating factors. She's never had a colonoscopy but has had previous CT abdomen and pelvis which showed questionable acute diverticulitis near the hepatic flexure.   She states she is about 80 and possibly 90% better compared to Sunday. No bloody stools. No diarrhea. No fevers or chills. Good appetite.  She has seen GI in the past. She has history of chronic intermittent constipation, though none recently. Denies any recent appetite or weight changes. No dysuria. She already has prescription for Cipro and Flagyl given by GI previously did take for any possible flareups of diverticulitis. She's never had colonoscopy screening.  Past Medical History  Diagnosis Date  . 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 GERD 03/01/2009  . Headache(784.0) 03/01/2009  . Abdominal pain, left lower quadrant 06/09/2010  . Viral conjunctivitis     h/o left eye  . Hiatal hernia   . 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 IBS (irritable bowel syndrome)     worse in college   Past Surgical History  Procedure Laterality Date  . Abdominal hysterectomy  12/09  . Leep  12/08    10/98 and then 12/08 CIN II/III involving glandsand with positive endocervical margin  . Ckc  6/09    CIN I/III with negative margins    reports that she quit smoking about 18 years ago. Her smoking use included Cigarettes. She has a 6 pack-year smoking history. She has never used smokeless tobacco. She reports that she drinks about 12.0 - 13.2 oz of alcohol per week. She reports that she does not use illicit drugs. family history includes Alcohol abuse in her other; Arthritis in her other;  Cancer in her other; Colon polyps in her mother; Diabetes in her maternal grandmother; Diverticulosis in her father and mother; Heart disease in her maternal grandfather and other; Hypertension in her father, mother, and other; Osteoporosis in her maternal grandmother. Allergies  Allergen Reactions  . Penicillins       Review of Systems  Constitutional: Negative for fever, chills, appetite change and unexpected weight change.  Respiratory: Negative for shortness of breath.   Gastrointestinal: Positive for abdominal pain. Negative for nausea, vomiting, diarrhea and blood in stool.  Genitourinary: Negative for dysuria and difficulty urinating.  Musculoskeletal: Negative for back pain.       Objective:   Physical Exam  Constitutional: She appears well-developed and well-nourished.  Cardiovascular: Normal rate and regular rhythm.   Pulmonary/Chest: Effort normal and breath sounds normal. No respiratory distress. She has no wheezes. She has no rales.  Abdominal: Soft. Bowel sounds are normal. She exhibits no distension and no mass. There is no rebound and no guarding.  Very minimal tenderness left lower quadrant to deep palpation. No guarding or rebound.          Assessment & Plan:  Transient left lower quadrant abdominal pain. Almost resolved. Doubt acute diverticulitis but she's had questionable issues previously-though apparently not in the sigmoid region. Benign exam at this time. Observe for now. If she has any recurrent or persistent pain go ahead and start Cipro and Flagyl.

## 2016-02-02 NOTE — Patient Instructions (Signed)

## 2016-02-02 NOTE — Progress Notes (Signed)
Pre visit review using our clinic review tool, if applicable. No additional management support is needed unless otherwise documented below in the visit note. 

## 2016-03-03 IMAGING — CT CT ABD-PELV W/ CM
2 of 4 series · 10 of 36 positions shown, 17 images · IV contrast (READICAT/WATER & [ID] ISOVUE 300)
Comparison: Pelvic ultrasound at outside institution 12/03/2014. CT
abdomen/ pelvis 11/19/2007

CLINICAL DATA: Left lower quadrant pain for 1 year. Subjective
fever.

EXAM:
CT ABDOMEN AND PELVIS WITH CONTRAST
TECHNIQUE: Multidetector CT imaging of the abdomen and pelvis was performed
using the standard protocol following bolus administration of
intravenous contrast.
CONTRAST:  100mL [id number]-HTT IOPAMIDOL ([id number]-HTT) INJECTION 61%

[Series 3: abd/pelvis with · axial · 0.70mm/px · z∈[-326,+4]mm · 9 of 83 slices shown, 15 images]
[im 9/83  soft-tissue]
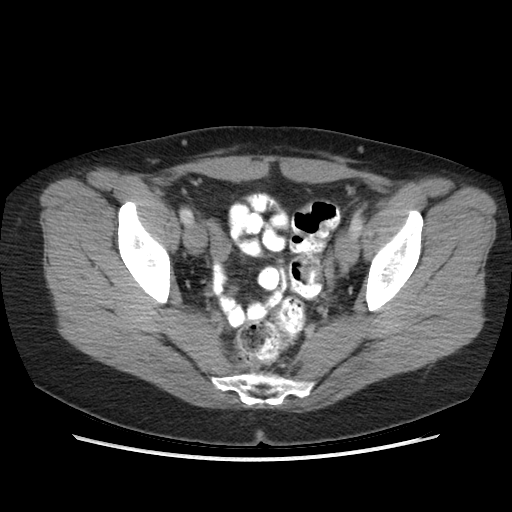
[im 9/83  bone]
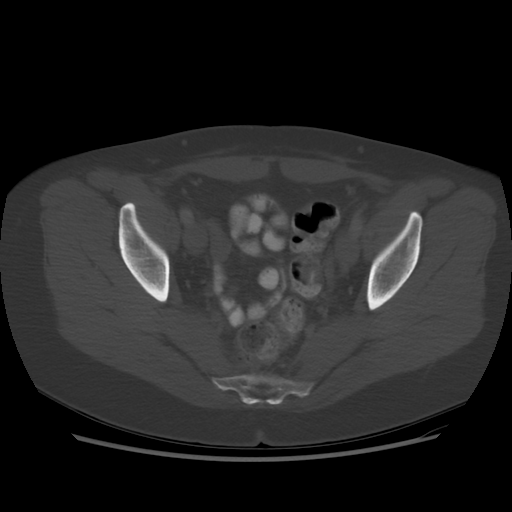
[im 17/83  soft-tissue]
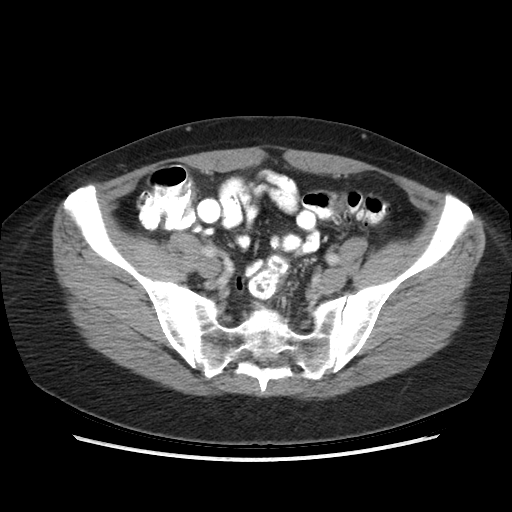
[im 25/83  soft-tissue]
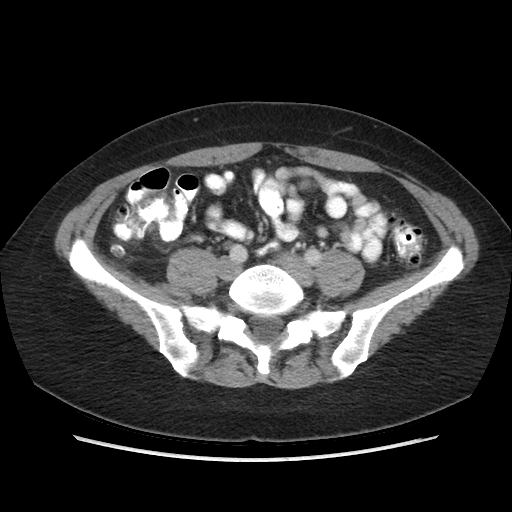
[im 33/83  soft-tissue]
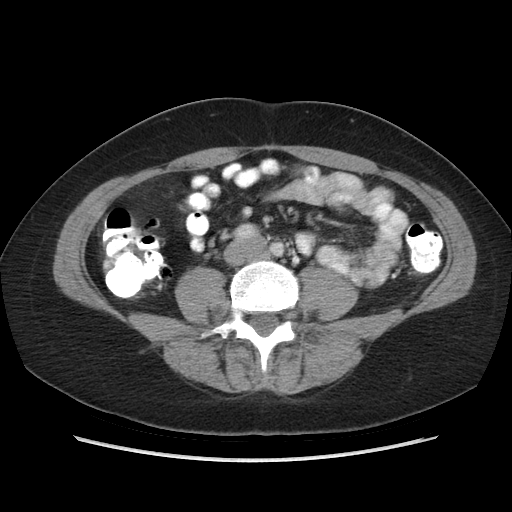
[im 42/83  soft-tissue]
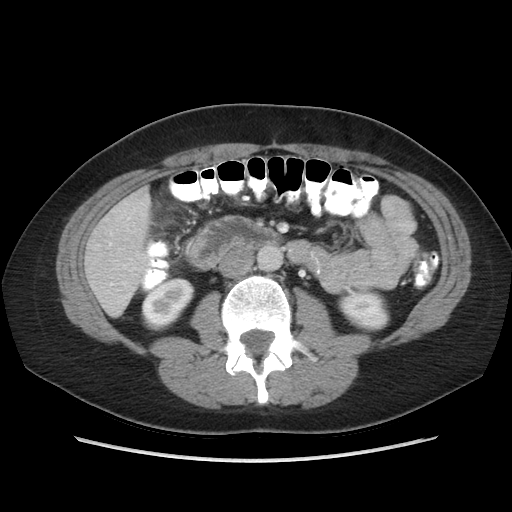
[im 50/83  soft-tissue]
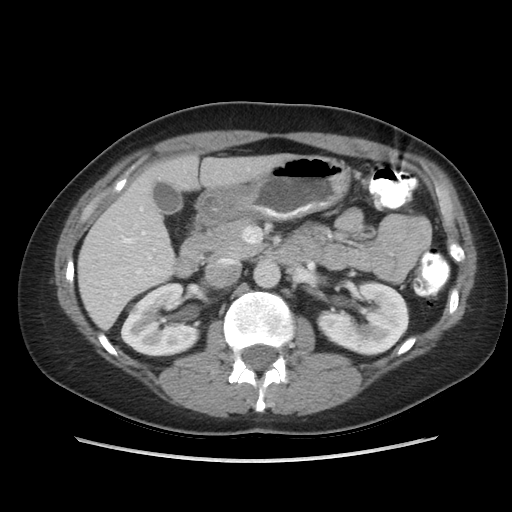
[im 50/83  lung]
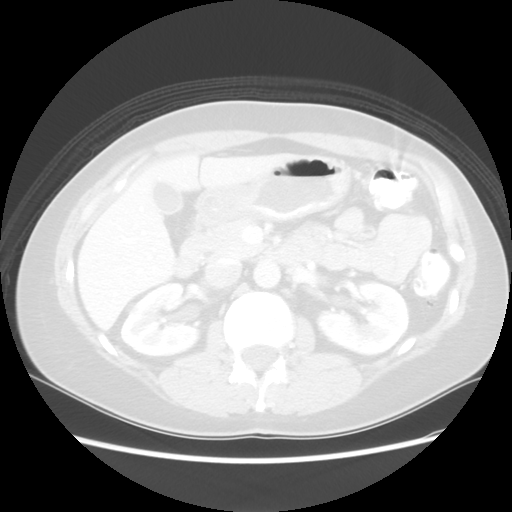
[im 58/83  soft-tissue]
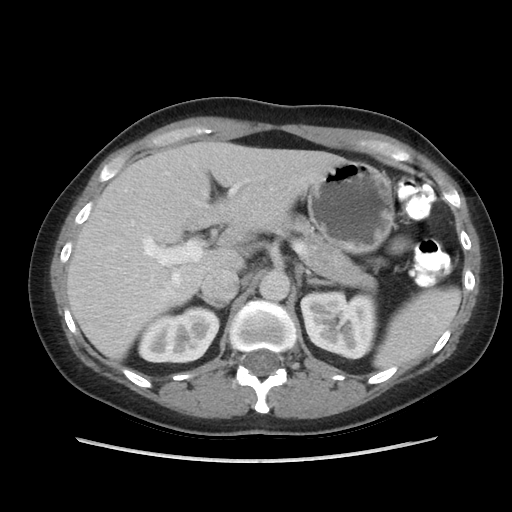
[im 58/83  lung]
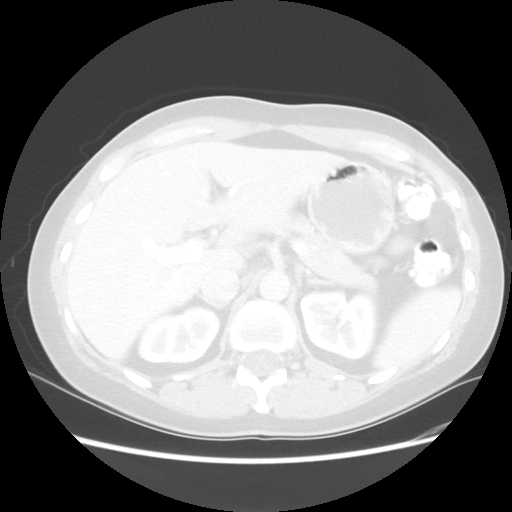
[im 66/83  soft-tissue]
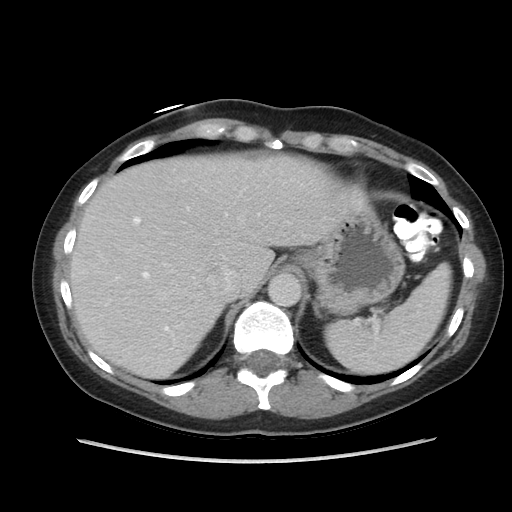
[im 66/83  lung]
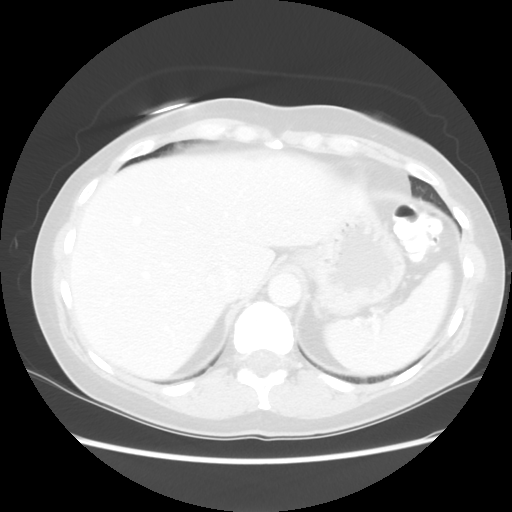
[im 74/83  soft-tissue]
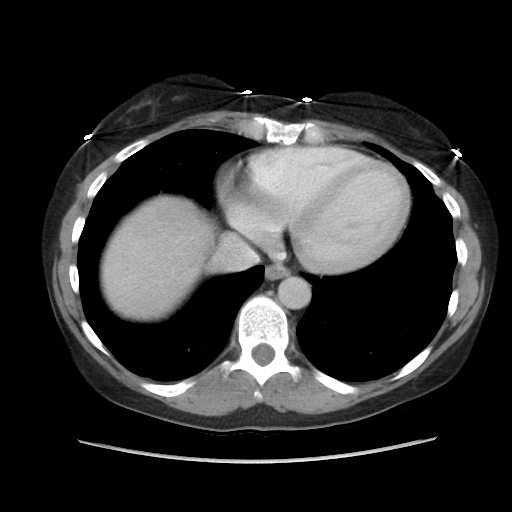
[im 74/83  lung]
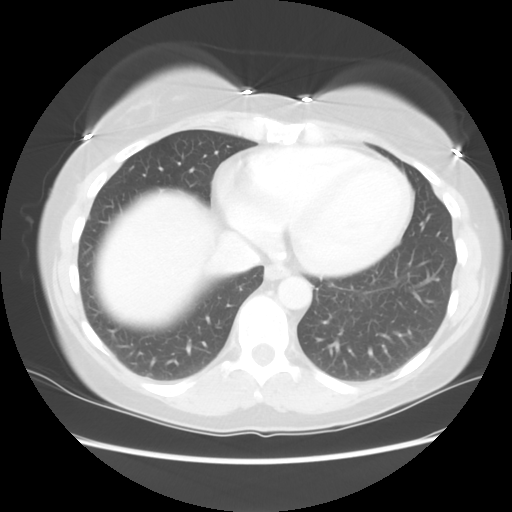
[im 74/83  bone]
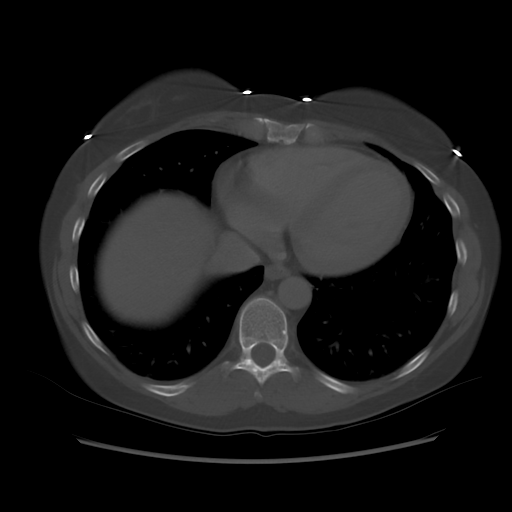

[Series 601: coronal body · coronal · 0.98mm/px · 1 of 117 slices shown, 2 images]
[im 39/117  soft-tissue]
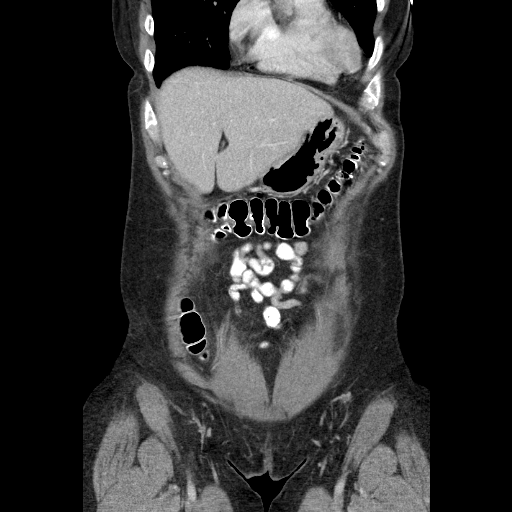
[im 39/117  bone]
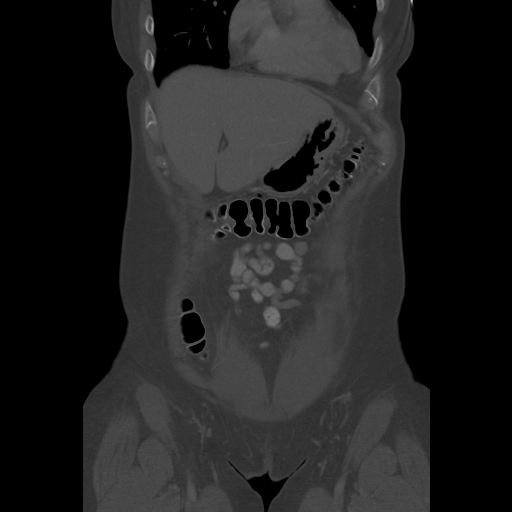

[10 of 36 positions shown; findings below may reference images not displayed]

FINDINGS: Lower chest:  Lung bases are clear.

Hepatobiliary: There is normal in appearance. Gallbladder
unremarkable.

Pancreas: Normal

Spleen: Normal

Adrenals/Urinary Tract: Adrenal glands are unremarkable. Bilateral
renal cortical cysts and too small to characterize renal cortical
hypodense lesions are stable. No hydronephrosis. No radiopaque
ureteral or bladder calculus.

Stomach/Bowel: There is focal hepatic flexure colonic wall
thickening with associated stranding surrounding a diverticulum,
axial image 43, coronal image 52. No free air or adjacent rim
enhancing fluid collection is identified. Small bowel is
unremarkable. Appendix appears normal.

Vascular/Lymphatic: No lymphadenopathy.  No aortic aneurysm.

Other: Trace free fluid is noted in the pelvis.  No free air.

Musculoskeletal: No acute osseous abnormality.
IMPRESSION: Focal hepatic flexure colonic wall thickening with pericolonic
stranding surrounding a colonic diverticulum, most likely indicating
early diverticulitis without complicating feature such as abscess
formation or free air. The appearance would be much less likely for
malignancy. Consider followup colonoscopy after symptoms resolve to
help exclude malignancy, as the patient reports left lower quadrant
pain chronically and this may not correlate with the reported
clinical symptoms.

These results will be called to the ordering clinician or
representative by the Radiologist Assistant, and communication
documented in the PACS or zVision Dashboard.

## 2016-06-05 ENCOUNTER — Other Ambulatory Visit: Payer: Self-pay | Admitting: Obstetrics & Gynecology

## 2016-06-05 NOTE — Telephone Encounter (Signed)
Medication refill request: Blisovi FE  Last AEX:  05-13-15 Next AEX: 08-24-16 Last MMG (if hormonal medication request): 12-13-15 U/S WNL Refill authorized: please advise

## 2016-06-21 ENCOUNTER — Telehealth: Payer: Self-pay | Admitting: Internal Medicine

## 2016-06-21 NOTE — Telephone Encounter (Signed)
Dr. Leone PayorGessner patient  Patient transferred care to Dr. Loreta AveMann. Patient is requesting to come back to our office for a 2nd opinion on L side pain. Patient states that she feels like she didn't get the [facility].   Dr. Leone PayorGessner- Do you accept patient back into your practice? Thanks.

## 2016-06-22 ENCOUNTER — Encounter: Payer: Self-pay | Admitting: Internal Medicine

## 2016-06-22 NOTE — Telephone Encounter (Signed)
Dr.Gessner has agreed to see patient for his next available office visit. Left message for patient to call back and schedule records will be in records file folder.

## 2016-07-12 ENCOUNTER — Telehealth: Payer: Self-pay | Admitting: Obstetrics & Gynecology

## 2016-07-12 NOTE — Telephone Encounter (Signed)
Patient has been having lower back pain with increased bloating.

## 2016-07-12 NOTE — Telephone Encounter (Signed)
Left message to call back  

## 2016-07-12 NOTE — Telephone Encounter (Signed)
Spoke with patient. Patient requesting appointment with Dr. Hyacinth MeekerMiller. Advised Dr. Hyacinth MeekerMiller out of the office today, can schedule with covering provider, patient declined. Patient states she has had lower back and abdominal pain with bloating after eating for the last "couple" of months. Patient states the discomfort is becoming more frequent. Denies any vaginal/pelvic, pain/discomfort. Denies vaginal bleeding/discharge. Last AEX with Dr. Hyacinth MeekerMiller 08/02/15. Reviewed with patient Dr. Garen LahMillers recommendations for colonoscopy with Dr. Loreta AveMann, patient states she was told by Dr. Loreta AveMann she did not need a colonoscopy. Patient states she is following up with a different GI doctor; reports no appt scheduled at this time. Patient states she is not sure if this is GI or GYN related and would like to discuss with Dr. Hyacinth MeekerMiller. Patient scheduled for OV 07/14/16 at 11:15am. Patient declined earlier appointment date offered; advised should she have any further concerns/questions please call office. Patient is agreeable to date and time.    Routing to provider for final review. Patient is agreeable to disposition. Will close encounter.    CC: Dr. Hyacinth MeekerMiller

## 2016-07-14 ENCOUNTER — Encounter: Payer: Self-pay | Admitting: Obstetrics & Gynecology

## 2016-07-14 ENCOUNTER — Ambulatory Visit (INDEPENDENT_AMBULATORY_CARE_PROVIDER_SITE_OTHER): Payer: BLUE CROSS/BLUE SHIELD | Admitting: Obstetrics & Gynecology

## 2016-07-14 VITALS — BP 108/70 | HR 76 | Temp 98.4°F | Resp 16 | Ht 66.75 in | Wt 165.0 lb

## 2016-07-14 DIAGNOSIS — Z01419 Encounter for gynecological examination (general) (routine) without abnormal findings: Secondary | ICD-10-CM

## 2016-07-14 DIAGNOSIS — N3010 Interstitial cystitis (chronic) without hematuria: Secondary | ICD-10-CM

## 2016-07-14 DIAGNOSIS — Z124 Encounter for screening for malignant neoplasm of cervix: Secondary | ICD-10-CM

## 2016-07-14 DIAGNOSIS — M545 Low back pain: Secondary | ICD-10-CM | POA: Diagnosis not present

## 2016-07-14 DIAGNOSIS — R350 Frequency of micturition: Secondary | ICD-10-CM

## 2016-07-14 LAB — POCT URINALYSIS DIPSTICK
Bilirubin, UA: NEGATIVE
Glucose, UA: NEGATIVE
KETONES UA: NEGATIVE
LEUKOCYTES UA: NEGATIVE
Nitrite, UA: NEGATIVE
PH UA: 6
PROTEIN UA: NEGATIVE
Urobilinogen, UA: NEGATIVE

## 2016-07-14 MED ORDER — NORETHIN ACE-ETH ESTRAD-FE 1-20 MG-MCG PO TABS: 1.0000 | ORAL_TABLET | Freq: Every day | ORAL | 4 refills | Status: DC

## 2016-07-14 NOTE — Progress Notes (Deleted)
GYNECOLOGY  VISIT   HPI: 40 y.o. 50P0000 Married Caucasian female with Patient's last menstrual period was 10/17/2007 (approximate).   here for lower abdominal pain and generalized lower back pain.  UA:RBC=Small  GYNECOLOGIC HISTORY: Patient's last menstrual period was 10/17/2007 (approximate). Contraception: Hysterectomy  Menopausal hormone therapy: Blisovi fe  Patient Active Problem List   Diagnosis Date Noted  . ABDOMINAL PAIN, LEFT LOWER QUADRANT 06/09/2010  . COUGH 09/22/2009  . GERD 03/01/2009  . HEADACHE 03/01/2009    Past Medical History:  Diagnosis Date  . Abdominal pain, left lower quadrant 06/09/2010  . 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 GERD 03/01/2009  . Headache(784.0) 03/01/2009  . Hiatal hernia   . IBS (irritable bowel syndrome)    worse in college  . Viral conjunctivitis    h/o left eye    Past Surgical History:  Procedure Laterality Date  . ABDOMINAL HYSTERECTOMY  12/09  . 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 ESOPHAGOGASTRODUODENOSCOPY  2014   [place] - [place]  . LEEP  12/08   10/98 and then 12/08 CIN II/III involving glandsand with positive endocervical margin    MEDS:  Reviewed in EPIC and UTD  ALLERGIES: Penicillins  Family History  Problem Relation Age of Onset  . Arthritis Other   . Alcohol abuse Other   . Cancer Other     lung  . Heart disease Other   . Hypertension Other     maternal side  . Diabetes Maternal Grandmother   . Osteoporosis Maternal Grandmother   . Hypertension Mother   . Colon polyps Mother   . Hypertension Father   . Heart disease Maternal Grandfather   . Diverticulosis Mother   . Diverticulosis Father     SH:  ***  Review of Systems  Gastrointestinal: Positive for abdominal pain and constipation.       Bloating  Genitourinary: Positive for flank pain.  Musculoskeletal: Positive for back  pain.  All other systems reviewed and are negative.   PHYSICAL EXAMINATION:    Ht 5' 6.75" (1.695 m)   LMP 10/17/2007 (Approximate)     General appearance: alert, cooperative and appears stated age Neck: no adenopathy, supple, symmetrical, trachea midline and thyroid {CHL AMB PHY EX THYROID NORM [phone]::"normal to inspection and palpation"} CV:  {Exam; heart brief:31539} Lungs:  {pe lungs ob:314451::"clear to auscultation, no wheezes, rales or rhonchi, symmetric air entry"} Breasts: {Exam; breast:13139::"normal appearance, no masses or tenderness"} Abdomen: soft, non-tender; bowel sounds normal; no masses,  no organomegaly  Pelvic: External genitalia:  no lesions              Urethra:  normal appearing urethra with no masses, tenderness or lesions              Bartholins and Skenes: normal                 Vagina: normal appearing vagina with normal color and discharge, no lesions              Cervix: {CHL AMB PHY EX CERVIX NORM [phone]::"no lesions"}              Bimanual Exam:  Uterus:  {CHL AMB PHY EX UTERUS NORM [phone]::"normal size, contour, position, consistency, mobility, non-tender"}              Adnexa: {CHL AMB PHY EX ADNEXA  NO MASS [phone]::"no mass, fullness, tenderness"}              Rectovaginal: {yes no:314532}.  Confirms.              Anus:  normal sphincter tone, no lesions  Chaperone was present for exam.  Assessment: ***  Plan: ***   ~{NUMBERS; -10-45 JOINT ROM:10287} minutes spent with patient >50% of time was in face to face discussion of above.

## 2016-07-14 NOTE — Progress Notes (Signed)
40 y.o. 170P0000 Married Caucasian female with complaint of abdominal pain that has been going on an off for about two months.  It is in the low midline in the front with associated low back pain that extends out laterally.  When this occurs, she has associated bloating.  She does have chronic constipation.  She can have some mild nausea as well.  Denies emesis and no fevers.    Denies pain with intercourse.  Denies hematuria.  She does have some frequency and hesitancy that is associated with the pain as well.    Pt reports about three months ago, she went to urgent care in [place].  Had xray of the abd without finding of stones.  Treated for constipation at that time and symptoms got better for a short while.  Was also advised there was blood in the urine during this evaluation.    Has seen Dr. Loreta AveMann.  Has had two CTs 2016, 11/2015.  Pt reports Dr. Loreta AveMann diagnosed her with diverticulitis and was treated with antibiotics.  That helped as well but symptoms are back.  Pt is seeing Dr. Leone PayorGessner for second opinion because Dr. Loreta AveMann declined doing a colonoscopy.  Pt just feels "something isn't right".      Patient's last menstrual period was 10/17/2007 (approximate).          Sexually active: Yes.    The current method of family planning is status post hysterectomy.    Exercising: Yes.   Smoker:  no  Health Maintenance: Pap:  2016 neg History of abnormal Pap:  hysterctomy due to recurrent abnormal pap smears MMG:  2/17 Colonoscopy:  n/a BMD:   n/a TDaP:  2011  Screening Labs: none today, Hb today: n/a, Urine today: small RBCs   reports that she quit smoking about 19 years ago. Her smoking use included Cigarettes. She has a 6.00 pack-year smoking history. She has never used smokeless tobacco. She reports that she drinks about 12.0 - 13.2 oz of alcohol per week . She reports that she does not use drugs.  Past Medical History:  Diagnosis Date  . Abdominal pain, left lower quadrant 06/09/2010  .  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 GERD 03/01/2009  . Headache(784.0) 03/01/2009  . Hiatal hernia   . IBS (irritable bowel syndrome)    worse in college  . Viral conjunctivitis    h/o left eye    Past Surgical History:  Procedure Laterality Date  . ABDOMINAL HYSTERECTOMY  12/09  . 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 ESOPHAGOGASTRODUODENOSCOPY  2014   [place] - [place]  . LEEP  12/08   10/98 and then 12/08 CIN II/III involving glandsand with positive endocervical margin    Current Outpatient Prescriptions  Medication Sig Dispense Refill  . BLISOVI FE 1/20 1-20 MG-MCG tablet TAKE 1 TABLET BY MOUTH DAILY. 84 tablet 1  . diphenhydrAMINE (BENADRYL) 25 mg capsule Take 25 mg by mouth every 6 (six) hours as needed.      . IRON PO Take by mouth daily.    . Lactobacillus (PROBIOTIC ACIDOPHILUS PO) Take by mouth daily.    . Naproxen Sodium 220 MG CAPS Take by mouth as needed.    . RABEprazole (ACIPHEX) 20 MG tablet TAKE 1 TABLET (20 MG TOTAL) BY MOUTH DAILY. GENERIC NECESSARY 30 tablet 5   No current facility-administered medications for  this visit.     Family History  Problem Relation Age of Onset  . Arthritis Other   . Alcohol abuse Other   . Cancer Other     lung  . Heart disease Other   . Hypertension Other     maternal side  . Diabetes Maternal Grandmother   . Osteoporosis Maternal Grandmother   . Hypertension Mother   . Colon polyps Mother   . Hypertension Father   . Heart disease Maternal Grandfather   . Diverticulosis Mother   . Diverticulosis Father     ROS:  Pertinent items are noted in HPI.  Otherwise, a comprehensive ROS was negative.  Exam:   BP 108/70 (BP Location: Right Arm, Patient Position: Sitting, Cuff Size: Normal)   Pulse 76   Temp 98.4 F (36.9 C) (Oral)   Resp 16   Ht 5' 6.75" (1.695 m)   Wt 165 lb (74.8 kg)   LMP 10/17/2007  (Approximate)   BMI 26.04 kg/m   Height: 5' 6.75" (169.5 cm)  Ht Readings from Last 3 Encounters:  07/14/16 5' 6.75" (1.695 m)  02/02/16 5' 6.75" (1.695 m)  11/10/15 5' 6.75" (1.695 m)    General appearance: alert, cooperative and appears stated age Head: Normocephalic, without obvious abnormality, atraumatic Neck: no adenopathy, supple, symmetrical, trachea midline and thyroid normal to inspection and palpation Lungs: clear to auscultation bilaterally Breasts: normal appearance, no masses or tenderness Heart: regular rate and rhythm Abdomen: soft, non-tender; bowel sounds normal; no masses,  no organomegaly Extremities: extremities normal, atraumatic, no cyanosis or edema Skin: Skin color, texture, turgor normal. No rashes or lesions Lymph nodes: Cervical, supraclavicular, and axillary nodes normal. No abnormal inguinal nodes palpated Neurologic: Grossly normal   Pelvic: External genitalia:  no lesions              Urethra:  normal appearing urethra with no masses, tenderness or lesions              Bartholins and Skenes: normal                 Vagina: normal appearing vagina with normal color and discharge, no lesions              Cervix: absent              Pap taken: Yes.   Bimanual Exam:  Uterus:  uterus absent              Adnexa: normal adnexa and no mass, fullness, tenderness               Rectovaginal: Confirms               Anus:  normal sphincter tone, no lesions    Pelvic floor pain noted on physical exam today  Chaperone was present for exam.  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 Leone Payor for second opinion regarding colonoscopy May need pelvic PT Continue OCPs.  Rx to  pharmacy. return annually or prn

## 2016-07-15 LAB — URINALYSIS, MICROSCOPIC ONLY
Bacteria, UA: NONE SEEN [HPF]
Casts: NONE SEEN [LPF]
Crystals: NONE SEEN [HPF]
WBC UA: NONE SEEN WBC/HPF (ref <=5)
YEAST: NONE SEEN [HPF]

## 2016-07-18 ENCOUNTER — Ambulatory Visit (INDEPENDENT_AMBULATORY_CARE_PROVIDER_SITE_OTHER): Payer: BLUE CROSS/BLUE SHIELD | Admitting: Family Medicine

## 2016-07-18 VITALS — BP 122/90 | HR 85 | Temp 98.6°F | Ht 66.75 in | Wt 166.7 lb

## 2016-07-18 DIAGNOSIS — M545 Low back pain, unspecified: Secondary | ICD-10-CM

## 2016-07-18 DIAGNOSIS — R1032 Left lower quadrant pain: Secondary | ICD-10-CM | POA: Diagnosis not present

## 2016-07-18 LAB — IPS PAP TEST WITH HPV

## 2016-07-18 MED ORDER — CIPROFLOXACIN HCL 500 MG PO TABS: 500.0000 mg | ORAL_TABLET | Freq: Two times a day (BID) | ORAL | 0 refills | Status: DC

## 2016-07-18 MED ORDER — METRONIDAZOLE 500 MG PO TABS: 500.0000 mg | ORAL_TABLET | Freq: Three times a day (TID) | ORAL | 0 refills | Status: DC

## 2016-07-18 NOTE — Progress Notes (Signed)
Pre visit review using our clinic review tool, if applicable. No additional management support is needed unless otherwise documented below in the visit note. 

## 2016-07-18 NOTE — Progress Notes (Signed)
Subjective:     Patient ID: Monica Bailey, female   DOB: December 05, 1975, 40 y.o.   MRN: [id number]  HPI Patient seen with left low back pain and left lower quadrant abdominal pain. Her symptoms have been progressive over the past couple weeks. She has a long history of constipation and some chronic abdominal pain. She saw GI back in August 2016 and had CT scan which showed probable diverticulitis. She did improve following antibiotics at that time. Her current pain is both left lower quadrant abdomen and also seems to radiate toward the left flank area. She has no history of known kidney stones. She did apparently have some blood on urine dipstick when she saw gynecologist last week. She has been referred to urology for possible cystoscopy to rule out interstitial cystitis.  She does relate some recent chills and nausea but no vomiting. No dysuria. She's taken Aleve with some relief of her back pain. Denies any radiculopathy symptoms. No numbness or weakness lower extremities. Symptoms are somewhat better when lying on her stomach She's not noted any bloody stools. She has pending second opinion with GI in November She does not report any recent weight changes. She's had previous total abdominal hysterectomy.  No recent skin rash.    Past Medical History:  Diagnosis Date  . Abdominal pain, left lower quadrant 06/09/2010  . 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 GERD 03/01/2009  . Headache(784.0) 03/01/2009  . Hiatal hernia   . IBS (irritable bowel syndrome)    worse in college  . Viral conjunctivitis    h/o left eye   Past Surgical History:  Procedure Laterality Date  . ABDOMINAL HYSTERECTOMY  12/09  . 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 ESOPHAGOGASTRODUODENOSCOPY  2014   [place] - [place]  . LEEP  12/08   10/98 and then 12/08 CIN II/III involving glandsand with positive  endocervical margin    reports that she quit smoking about 19 years ago. Her smoking use included Cigarettes. She has a 6.00 pack-year smoking history. She has never used smokeless tobacco. She reports that she drinks about 12.0 - 13.2 oz of alcohol per week . She reports that she does not use drugs. family history includes Alcohol abuse in her other; Arthritis in her other; Cancer in her other; Colon polyps in her mother; Diabetes in her maternal grandmother; Diverticulosis in her father and mother; Heart disease in her maternal grandfather and other; Hypertension in her father, mother, and other; Osteoporosis in her maternal grandmother. Allergies  Allergen Reactions  . Penicillins      Review of Systems  Constitutional: Positive for appetite change, chills and fatigue. Negative for fever.  Respiratory: Negative for shortness of breath.   Cardiovascular: Negative for chest pain.  Gastrointestinal: Positive for abdominal pain, constipation and nausea. Negative for blood in stool, diarrhea and vomiting.  Genitourinary: Negative for dysuria.  Musculoskeletal: Positive for back pain.  Skin: Negative for rash.       Objective:   Physical Exam  Constitutional: She appears well-developed and well-nourished.  Cardiovascular: Normal rate and regular rhythm.   Pulmonary/Chest: Effort normal and breath sounds normal. No respiratory distress. She has no wheezes. She has no rales.  Abdominal: Soft. Bowel sounds are normal. She exhibits no distension and no mass. There is tenderness. There is no rebound and no guarding.  She has mild  tenderness in palpating left lower quadrant. No guarding or rebound. No masses.  Musculoskeletal:  Back is nontender to palpation. Straight leg raises are negative.  Neurological:  Full-strength lower extremities. Symmetric reflexes.  Skin: No rash noted.       Assessment:     #1 left lower quadrant abdominal pain in a patient with history of chronic  constipation and somewhat chronic intermittent abdominal pain. She does have past history of probable acute diverticulitis by CT scan August 2016. Would consider acute diverticulitis in differential for abdominal pain  #2 left-sided low back pain without sciatica. Nonfocal exam neurologically. We did explain this could be partly an etiology for her abdominal pain though the fact that she has reproducible tenderness on abdominal exam with suggest against this    Plan:     -Start Cipro 500 milligrams twice a day for 10 days and Flagyl 500 milligrams 3 times a day for 10 days -Keep follow-up with GI as scheduled -Touch base in one week if abdominal symptoms not improved with antibiotics and sooner if she has any fever or worsening pain or new symptoms -Follow-up promptly for any sciatica type symptoms especially with any left lower extremity numbness or weakness  Kristian CoveyBruce W Chayah Mckee MD [facility]

## 2016-07-18 NOTE — Patient Instructions (Signed)
Follow up for any fever or progressive abdominal pain. Touch base in 1 one week if abdominal pain not improved.

## 2016-07-21 ENCOUNTER — Telehealth: Payer: Self-pay | Admitting: Family Medicine

## 2016-07-21 NOTE — Telephone Encounter (Signed)
Pt was seen on 07-18-16 and no better. Please advise

## 2016-07-24 NOTE — Telephone Encounter (Signed)
I would not recommend CT unless she has any fever or worsening pain. She is followed by GI and I recommend follow up with them

## 2016-07-25 NOTE — Telephone Encounter (Signed)
Left message on voicemail to call office.  

## 2016-07-31 ENCOUNTER — Encounter: Payer: Self-pay | Admitting: Internal Medicine

## 2016-07-31 ENCOUNTER — Ambulatory Visit (INDEPENDENT_AMBULATORY_CARE_PROVIDER_SITE_OTHER): Payer: BLUE CROSS/BLUE SHIELD | Admitting: Internal Medicine

## 2016-07-31 VITALS — BP 110/80 | HR 80 | Ht 66.0 in | Wt 164.5 lb

## 2016-07-31 DIAGNOSIS — K5732 Diverticulitis of large intestine without perforation or abscess without bleeding: Secondary | ICD-10-CM | POA: Diagnosis not present

## 2016-07-31 DIAGNOSIS — R1032 Left lower quadrant pain: Secondary | ICD-10-CM | POA: Diagnosis not present

## 2016-07-31 NOTE — Patient Instructions (Addendum)
   You have been scheduled for a colonoscopy. Please follow written instructions given to you at your visit today.  Please pick up your prep supplies at the pharmacy. If you use inhalers (even only as needed), please bring them with you on the day of your procedure. Your physician has requested that you go to www.startemmi.com and enter the access code given to you at your visit today. This web site gives a general overview about your procedure. However, you should still follow specific instructions given to you by our office regarding your preparation for the procedure.   Today we are giving you a handout on benefiber to read and follow.  Use 1- 2 tablespoons at bedtime.  Use this in place of your colace.    I appreciate the opportunity to care for you. Stan Headarl Gessner, MD, [facility]

## 2016-07-31 NOTE — Progress Notes (Signed)
Genella MechBrenna L Thornhill 40 y.o. 10/09/1976 [id number]  Assessment & Plan:   Encounter Diagnoses  Name Primary?  . Diverticulitis of colon without hemorrhage Yes  . LLQ pain    She's had chronic recurrent symptoms and a CT scan images viewed of August 2016 showed what does look like probable diverticulitis though it was in a ascending colon area or proximal transverse. That wouldn't necessarily correlate with her pain but she may be having recurrent low-grade diverticulitis versus symptomatic diverticulosis or both. Because of the chronicity the changes on CT scan and some mild constipation I think it is reasonable to evaluate the mucosa of the colon with a colonoscopy. She agrees.  She could certainly continue amitriptyline as prescribed by Dr. Retta Dionesahlstedt but she wants to hold that right now. I explained to her that might be a useful medication for her chronic symptoms. We'll have her try one to 2 tablespoons of Benefiber nightly instead of Colace.  The risks and benefits as well as alternatives of endoscopic procedure(s) have been discussed and reviewed. All questions answered. The patient agrees to proceed.  Pelvic floor physical therapy is a good idea also. We'll be interested to see the results of that.     Subjective:   Chief Complaint:Left lower quadrant pain, diverticulitis  HPI Is a pleasant 40 year old woman I saw last year, she had chronic left lower quadrant pain that I was not certain as to the cause. She ended up seeing Dr. Loreta AveMann who prescribed Colace. Subsequently she had a CT scan in August 2016 and had focal changes of probable diverticulitis in the right colon area. More recently she's had a flareup of symptoms, left lower quadrant vague but persistent dull ache and pain and some back pressure, and she had blood in her urine on urinalysis at the gynecologist office. She saw Dr. Retta Dionesahlstedt of urology, he did not think she had interstitial cystitis, he prescribed amitriptyline. She  did not have microhematuria when she saw him. He has referred her for pelvic floor physical therapy. She plans to do that. She tends towards constipation she says. She is a runner although walking more now she does yoga. No muscle injury known. She does have mild sciatica at times. Some left buttock pain. No radiation into the legs. She did get a prescription for antibiotics for suspected diverticulitis recently and felt improved. Allergies  Allergen Reactions  . Penicillins    Outpatient Medications Prior to Visit  Medication Sig Dispense Refill  . diphenhydrAMINE (BENADRYL) 25 mg capsule Take 25 mg by mouth every 6 (six) hours as needed.      Tery Sanfilippo. Docusate Sodium (COLACE PO) Take by mouth.    . IRON PO Take by mouth daily.    . Naproxen Sodium 220 MG CAPS Take by mouth as needed.    . norethindrone-ethinyl estradiol (BLISOVI FE 1/20) 1-20 MG-MCG tablet Take 1 tablet by mouth daily. 84 tablet 4  . Probiotic Product (PROBIOTIC DAILY PO) Take by mouth. Ultra Flora    . RABEprazole (ACIPHEX) 20 MG tablet TAKE 1 TABLET (20 MG TOTAL) BY MOUTH DAILY. GENERIC NECESSARY 30 tablet 5  . ciprofloxacin (CIPRO) 500 MG tablet Take 1 tablet (500 mg total) by mouth 2 (two) times daily. 20 tablet 0  . metroNIDAZOLE (FLAGYL) 500 MG tablet Take 1 tablet (500 mg total) by mouth 3 (three) times daily. 30 tablet 0   No facility-administered medications prior to visit.    Past Medical History:  Diagnosis Date  . Abdominal pain,  left lower quadrant 06/09/2010  . 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 GERD 03/01/2009  . Headache(784.0) 03/01/2009  . Hiatal hernia   . IBS (irritable bowel syndrome)    worse in college  . Viral conjunctivitis    h/o left eye   Past Surgical History:  Procedure Laterality Date  . ABDOMINAL HYSTERECTOMY  12/09  . 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  ESOPHAGOGASTRODUODENOSCOPY  2014   [place] - [place]  . LEEP  12/08   10/98 and then 12/08 CIN II/III involving glandsand with positive endocervical margin   Social History   Social History  . Marital status: Married    Spouse name: N/A  . Number of children: N/A  . Years of education: N/A   Occupational History  . Emergency planning/management officer    Social History Main Topics  . Smoking status: Former Smoker    Packs/day: 1.00    Years: 6.00    Types: Cigarettes    Quit date: 02/12/1997  . Smokeless tobacco: Never Used  . Alcohol use 12.0 - 13.2 oz/week    12 - 14 Glasses of wine, 8 Standard drinks or equivalent per week     Comment: 12-14  . Drug use: No  . Sexual activity: Yes    Partners: Male    Birth control/ protection: Surgical     Comment: TAH   Other Topics Concern  . None   Social History Narrative   Married, no children   Employed as a Emergency planning/management officer   1-2 alcoholic beverages daily in one caffeinated beverage daily   03/02/2015, last update      Family History  Problem Relation Age of Onset  . Arthritis Other   . Alcohol abuse Other   . Cancer Other     lung  . Heart disease Other   . Hypertension Other     maternal side  . Diabetes Maternal Grandmother   . Osteoporosis Maternal Grandmother   . Hypertension Mother   . Colon polyps Mother   . Diverticulosis Mother   . Hypertension Father   . Diverticulosis Father   . Heart disease Maternal Grandfather    Review of Systems As per history of present illness  Objective:   Physical Exam @BP  110/80 (BP Location: Left Arm, Patient Position: Sitting, Cuff Size: Normal)   Pulse 80   Ht 5\' 6"  (1.676 m) Comment: height measured without shoes  Wt 164 lb 8 oz (74.6 kg)   LMP 10/17/2007 (Approximate)   BMI 26.55 kg/m @  General:  NAD Eyes:   anicteric Lungs:  clear Heart::  S1S2 no rubs, murmurs or gallops Abdomen:  soft and mildly tender left greater than right lower quadrants, BS+ Ext:   no edema, cyanosis  or clubbing    Data Reviewed:  As per history of present illness October 3 visit with Dr. Caryl Never where she was prescribed Cipro and Flagyl September 29 well woman visit with gynecologist 2016 visits with Dr. Loreta Ave of GI  I appreciate the opportunity to care for this patient. CC: Kristian Covey, MD Dr. Patsi Sears

## 2016-08-02 NOTE — Telephone Encounter (Signed)
Pt has a pending appt with GI on 08/09/2016.

## 2016-08-09 ENCOUNTER — Ambulatory Visit (AMBULATORY_SURGERY_CENTER): Payer: BLUE CROSS/BLUE SHIELD | Admitting: Internal Medicine

## 2016-08-09 ENCOUNTER — Encounter: Payer: Self-pay | Admitting: Internal Medicine

## 2016-08-09 VITALS — BP 158/97 | HR 61 | Temp 98.2°F | Resp 15 | Ht 66.0 in | Wt 164.0 lb

## 2016-08-09 DIAGNOSIS — K5732 Diverticulitis of large intestine without perforation or abscess without bleeding: Secondary | ICD-10-CM

## 2016-08-09 DIAGNOSIS — R1032 Left lower quadrant pain: Secondary | ICD-10-CM | POA: Diagnosis not present

## 2016-08-09 MED ORDER — SODIUM CHLORIDE 0.9 % IV SOLN: 500.0000 mL | INTRAVENOUS | Status: DC

## 2016-08-09 NOTE — Op Note (Signed)
[facility] Patient Name: Monica Bailey Procedure Date: 08/09/2016 8:11 AM MRN: [id number] Endoscopist: Iva Boop , MD Age: 40 Referring MD:  Date of Birth: 06-Apr-1976 Gender: Female Account #: [id number] Procedure:                Colonoscopy Indications:              Abdominal pain in the left lower quadrant,                            Diverticulitis, Follow-up of diverticulitis,                            Constipation Medicines:                Propofol per Anesthesia, Monitored Anesthesia Care Procedure:                Pre-Anesthesia Assessment:                           - Prior to the procedure, a History and Physical                            was performed, and patient medications and                            allergies were reviewed. The patient's tolerance of                            previous anesthesia was also reviewed. The risks                            and benefits of the procedure and the sedation                            options and risks were discussed with the patient.                            All questions were answered, and informed consent                            was obtained. Prior Anticoagulants: The patient has                            taken no previous anticoagulant or antiplatelet                            agents. ASA Grade Assessment: II - A patient with                            mild systemic disease. After reviewing the risks                            and benefits, the patient was deemed in  satisfactory condition to undergo the procedure.                           After obtaining informed consent, the colonoscope                            was passed under direct vision. Throughout the                            procedure, the patient's blood pressure, pulse, and                            oxygen saturations were monitored continuously. The                            Model CF-HQ190L [id number](SN#[id number]) scope  was introduced                            through the anus and advanced to the the cecum,                            identified by appendiceal orifice and ileocecal                            valve. The colonoscopy was performed without                            difficulty. The patient tolerated the procedure                            well. The quality of the bowel preparation was                            good. The bowel preparation used was Miralax. The                            ileocecal valve, appendiceal orifice, and rectum                            were photographed. Scope In: 8:16:37 AM Scope Out: 8:27:19 AM Scope Withdrawal Time: 0 hours 7 minutes 52 seconds  Total Procedure Duration: 0 hours 10 minutes 42 seconds  Findings:                 The perianal and digital rectal examinations were                            normal.                           Diverticula were found in the entire colon. There                            was no evidence of diverticular bleeding.  The exam was otherwise without abnormality on                            direct and retroflexion views. Complications:            No immediate complications. Estimated Blood Loss:     Estimated blood loss: none. Impression:               - Moderate diverticulosis in the entire examined                            colon. There was no evidence of diverticular                            bleeding.                           - The examination was otherwise normal on direct                            and retroflexion views.                           - No specimens collected. Recommendation:           - Patient has a contact number available for                            emergencies. The signs and symptoms of potential                            delayed complications were discussed with the                            patient. Return to normal activities tomorrow.                             Written discharge instructions were provided to the                            patient.                           - Resume previous diet.                           - Continue present medications.                           - Repeat colonoscopy in 10 years for screening                            purposes.                           - PROCEED WITH PELVIC FLOOR PT  STAY ON BENEFIBER                           SEE ME PRN                           CC ALSO DR. DAHLSTADT Iva Boop, MD 08/09/2016 8:33:52 AM This report has been signed electronically.

## 2016-08-09 NOTE — Patient Instructions (Addendum)
You have diverticulosis as we know.  All else ok.  Please continue fiber. Pelvic floor therapy can help. Amitriptyline remains reasonable but I understand if you want to hold off on that.  I appreciate the opportunity to care for you. Iva Boop, MD, FACG YOU HAD AN ENDOSCOPIC PROCEDURE TODAY AT THE [facility]:   Refer to the procedure report that was given to you for any specific questions about what was found during the examination.  If the procedure report does not answer your questions, please call your gastroenterologist to clarify.  If you requested that your care partner not be given the details of your procedure findings, then the procedure report has been included in a sealed envelope for you to review at your convenience later.  YOU SHOULD EXPECT: Some feelings of bloating in the abdomen. Passage of more gas than usual.  Walking can help get rid of the air that was put into your GI tract during the procedure and reduce the bloating. If you had a lower endoscopy (such as a colonoscopy or flexible sigmoidoscopy) you may notice spotting of blood in your stool or on the toilet paper. If you underwent a bowel prep for your procedure, you may not have a normal bowel movement for a few days.  Please Note:  You might notice some irritation and congestion in your nose or some drainage.  This is from the oxygen used during your procedure.  There is no need for concern and it should clear up in a day or so.  SYMPTOMS TO REPORT IMMEDIATELY:   Following lower endoscopy (colonoscopy or flexible sigmoidoscopy):  Excessive amounts of blood in the stool  Significant tenderness or worsening of abdominal pains  Swelling of the abdomen that is new, acute  Fever of 100F or higher   Following upper endoscopy (EGD)  Vomiting of blood or coffee ground material  New chest pain or pain under the shoulder blades  Painful or persistently difficult swallowing  New shortness of  breath  Fever of 100F or higher  Black, tarry-looking stools  For urgent or emergent issues, a gastroenterologist can be reached at any hour by calling [phone].   DIET:  We do recommend a small meal at first, but then you may proceed to your regular diet.  Drink plenty of fluids but you should avoid alcoholic beverages for 24 hours.  ACTIVITY:  You should plan to take it easy for the rest of today and you should NOT DRIVE or use heavy machinery until tomorrow (because of the sedation medicines used during the test).    FOLLOW UP: Our staff will call the number listed on your records the next business day following your procedure to check on you and address any questions or concerns that you may have regarding the information given to you following your procedure. If we do not reach you, we will leave a message.  However, if you are feeling well and you are not experiencing any problems, there is no need to return our call.  We will assume that you have returned to your regular daily activities without incident.  If any biopsies were taken you will be contacted by phone or by letter within the next 1-3 weeks.  Please call us at [phone] if you have not heard about the biopsies in 3 weeks.    SIGNATURES/CONFIDENTIALITY: You and/or your care partner have signed paperwork which will be entered into your electronic medical record.  These signatures  attest to the fact that that the information above on your After Visit Summary has been reviewed and is understood.  Full responsibility of the confidentiality of this discharge information lies with you and/or your care-partner.YOU HAD AN ENDOSCOPIC PROCEDURE TODAY AT THE [facility]:   Refer to the procedure report that was given to you for any specific questions about what was found during the examination.  If the procedure report does not answer your questions, please call your gastroenterologist to clarify.  If you requested  that your care partner not be given the details of your procedure findings, then the procedure report has been included in a sealed envelope for you to review at your convenience later.  YOU SHOULD EXPECT: Some feelings of bloating in the abdomen. Passage of more gas than usual.  Walking can help get rid of the air that was put into your GI tract during the procedure and reduce the bloating. If you had a lower endoscopy (such as a colonoscopy or flexible sigmoidoscopy) you may notice spotting of blood in your stool or on the toilet paper. If you underwent a bowel prep for your procedure, you may not have a normal bowel movement for a few days.  Please Note:  You might notice some irritation and congestion in your nose or some drainage.  This is from the oxygen used during your procedure.  There is no need for concern and it should clear up in a day or so.  SYMPTOMS TO REPORT IMMEDIATELY:   Following lower endoscopy (colonoscopy or flexible sigmoidoscopy):  Excessive amounts of blood in the stool  Significant tenderness or worsening of abdominal pains  Swelling of the abdomen that is new, acute  Fever of 100F or higher   Following upper endoscopy (EGD)  Vomiting of blood or coffee ground material  New chest pain or pain under the shoulder blades  Painful or persistently difficult swallowing  New shortness of breath  Fever of 100F or higher  Black, tarry-looking stools  For urgent or emergent issues, a gastroenterologist can be reached at any hour by calling [phone].   DIET:  We do recommend a small meal at first, but then you may proceed to your regular diet.  Drink plenty of fluids but you should avoid alcoholic beverages for 24 hours.  ACTIVITY:  You should plan to take it easy for the rest of today and you should NOT DRIVE or use heavy machinery until tomorrow (because of the sedation medicines used during the test).    FOLLOW UP: Our staff will call the number listed on  your records the next business day following your procedure to check on you and address any questions or concerns that you may have regarding the information given to you following your procedure. If we do not reach you, we will leave a message.  However, if you are feeling well and you are not experiencing any problems, there is no need to return our call.  We will assume that you have returned to your regular daily activities without incident.  If any biopsies were taken you will be contacted by phone or by letter within the next 1-3 weeks.  Please call us at [phone] if you have not heard about the biopsies in 3 weeks.    SIGNATURES/CONFIDENTIALITY: You and/or your care partner have signed paperwork which will be entered into your electronic medical record.  These signatures attest to the fact that that the information above on your After  Visit Summary has been reviewed and is understood.  Full responsibility of the confidentiality of this discharge information lies with you and/or your care-partner.  Repeat colonoscopy in 10 years-2027 for screening purposes.

## 2016-08-09 NOTE — Progress Notes (Signed)
Report to PACU, RN, vss, BBS= Clear.  

## 2016-08-10 ENCOUNTER — Telehealth: Payer: Self-pay

## 2016-08-10 NOTE — Telephone Encounter (Signed)
  Follow up Call-  Call back number 08/09/2016  Post procedure Call Back phone  # [phone]  Permission to leave phone message Yes  Some recent data might be hidden    Patient was called for follow up after her procedure on 08/09/2016. No answer at the number given for follow up phone call. A message was left on the answering machine.

## 2016-08-10 NOTE — Telephone Encounter (Signed)
  Follow up Call-  Call back number 08/09/2016  Post procedure Call Back phone  # [phone]  Permission to leave phone message Yes  Some recent data might be hidden     Patient questions:  Do you have a fever, pain , or abdominal swelling? No. Pain Score  0 *  Have you tolerated food without any problems? Yes.    Have you been able to return to your normal activities? Yes.    Do you have any questions about your discharge instructions: Diet   No. Medications  No. Follow up visit  No.  Do you have questions or concerns about your Care? No.  Actions: * If pain score is 4 or above: No action needed, pain <4.

## 2016-08-15 ENCOUNTER — Ambulatory Visit: Payer: BLUE CROSS/BLUE SHIELD | Admitting: Obstetrics & Gynecology

## 2016-08-15 DIAGNOSIS — R102 Pelvic and perineal pain: Secondary | ICD-10-CM | POA: Diagnosis not present

## 2016-08-15 DIAGNOSIS — M62838 Other muscle spasm: Secondary | ICD-10-CM | POA: Diagnosis not present

## 2016-08-15 DIAGNOSIS — R3915 Urgency of urination: Secondary | ICD-10-CM | POA: Diagnosis not present

## 2016-08-15 DIAGNOSIS — R278 Other lack of coordination: Secondary | ICD-10-CM | POA: Diagnosis not present

## 2016-08-16 DIAGNOSIS — F419 Anxiety disorder, unspecified: Secondary | ICD-10-CM | POA: Diagnosis not present

## 2016-08-16 DIAGNOSIS — Z87891 Personal history of nicotine dependence: Secondary | ICD-10-CM | POA: Diagnosis not present

## 2016-08-16 DIAGNOSIS — K5730 Diverticulosis of large intestine without perforation or abscess without bleeding: Secondary | ICD-10-CM | POA: Diagnosis not present

## 2016-08-16 DIAGNOSIS — R102 Pelvic and perineal pain: Secondary | ICD-10-CM | POA: Diagnosis not present

## 2016-08-16 DIAGNOSIS — R1031 Right lower quadrant pain: Secondary | ICD-10-CM | POA: Diagnosis not present

## 2016-08-16 DIAGNOSIS — N281 Cyst of kidney, acquired: Secondary | ICD-10-CM | POA: Diagnosis not present

## 2016-08-18 DIAGNOSIS — M6281 Muscle weakness (generalized): Secondary | ICD-10-CM | POA: Diagnosis not present

## 2016-08-18 DIAGNOSIS — R102 Pelvic and perineal pain: Secondary | ICD-10-CM | POA: Diagnosis not present

## 2016-08-18 DIAGNOSIS — R278 Other lack of coordination: Secondary | ICD-10-CM | POA: Diagnosis not present

## 2016-08-18 DIAGNOSIS — M62838 Other muscle spasm: Secondary | ICD-10-CM | POA: Diagnosis not present

## 2016-08-19 ENCOUNTER — Encounter: Payer: Self-pay | Admitting: Internal Medicine

## 2016-08-24 ENCOUNTER — Ambulatory Visit: Payer: BLUE CROSS/BLUE SHIELD | Admitting: Obstetrics & Gynecology

## 2016-08-24 DIAGNOSIS — R278 Other lack of coordination: Secondary | ICD-10-CM | POA: Diagnosis not present

## 2016-08-24 DIAGNOSIS — M62838 Other muscle spasm: Secondary | ICD-10-CM | POA: Diagnosis not present

## 2016-08-24 DIAGNOSIS — M6281 Muscle weakness (generalized): Secondary | ICD-10-CM | POA: Diagnosis not present

## 2016-08-24 DIAGNOSIS — R102 Pelvic and perineal pain: Secondary | ICD-10-CM | POA: Diagnosis not present

## 2016-08-28 ENCOUNTER — Ambulatory Visit: Payer: BLUE CROSS/BLUE SHIELD | Admitting: Internal Medicine

## 2016-08-28 ENCOUNTER — Other Ambulatory Visit: Payer: Self-pay | Admitting: Family Medicine

## 2016-08-31 DIAGNOSIS — M62838 Other muscle spasm: Secondary | ICD-10-CM | POA: Diagnosis not present

## 2016-08-31 DIAGNOSIS — R102 Pelvic and perineal pain: Secondary | ICD-10-CM | POA: Diagnosis not present

## 2016-08-31 DIAGNOSIS — R278 Other lack of coordination: Secondary | ICD-10-CM | POA: Diagnosis not present

## 2016-08-31 DIAGNOSIS — M6281 Muscle weakness (generalized): Secondary | ICD-10-CM | POA: Diagnosis not present

## 2016-09-04 ENCOUNTER — Emergency Department (HOSPITAL_COMMUNITY)
Admission: EM | Admit: 2016-09-04 | Discharge: 2016-09-04 | Disposition: A | Discharge status: Home / Self Care | Payer: BLUE CROSS/BLUE SHIELD | Attending: Emergency Medicine | Admitting: Emergency Medicine

## 2016-09-04 ENCOUNTER — Emergency Department (HOSPITAL_COMMUNITY): Payer: BLUE CROSS/BLUE SHIELD

## 2016-09-04 ENCOUNTER — Encounter (HOSPITAL_COMMUNITY): Payer: Self-pay | Admitting: Emergency Medicine

## 2016-09-04 DIAGNOSIS — Z87891 Personal history of nicotine dependence: Secondary | ICD-10-CM | POA: Insufficient documentation

## 2016-09-04 DIAGNOSIS — R0789 Other chest pain: Secondary | ICD-10-CM | POA: Insufficient documentation

## 2016-09-04 DIAGNOSIS — Z79899 Other long term (current) drug therapy: Secondary | ICD-10-CM | POA: Insufficient documentation

## 2016-09-04 DIAGNOSIS — R079 Chest pain, unspecified: Secondary | ICD-10-CM | POA: Diagnosis not present

## 2016-09-04 DIAGNOSIS — R030 Elevated blood-pressure reading, without diagnosis of hypertension: Secondary | ICD-10-CM | POA: Diagnosis not present

## 2016-09-04 DIAGNOSIS — M542 Cervicalgia: Secondary | ICD-10-CM | POA: Diagnosis not present

## 2016-09-04 DIAGNOSIS — R201 Hypoesthesia of skin: Secondary | ICD-10-CM | POA: Diagnosis not present

## 2016-09-04 LAB — HEPATIC FUNCTION PANEL
ALT: 34 U/L (ref 14–54)
AST: 46 U/L — AB (ref 15–41)
Albumin: 4.1 g/dL (ref 3.5–5.0)
Alkaline Phosphatase: 23 U/L — ABNORMAL LOW (ref 38–126)
BILIRUBIN DIRECT: 0.2 mg/dL (ref 0.1–0.5)
BILIRUBIN INDIRECT: 1 mg/dL — AB (ref 0.3–0.9)
BILIRUBIN TOTAL: 1.2 mg/dL (ref 0.3–1.2)
Total Protein: 7.8 g/dL (ref 6.5–8.1)

## 2016-09-04 LAB — BASIC METABOLIC PANEL
ANION GAP: 9 (ref 5–15)
BUN: 9 mg/dL (ref 6–20)
CALCIUM: 9.4 mg/dL (ref 8.9–10.3)
CO2: 22 mmol/L (ref 22–32)
CREATININE: 0.73 mg/dL (ref 0.44–1.00)
Chloride: 106 mmol/L (ref 101–111)
GLUCOSE: 100 mg/dL — AB (ref 65–99)
Potassium: 3.5 mmol/L (ref 3.5–5.1)
Sodium: 137 mmol/L (ref 135–145)

## 2016-09-04 LAB — CBC
HCT: 39 % (ref 36.0–46.0)
HEMOGLOBIN: 13.1 g/dL (ref 12.0–15.0)
MCH: 33.1 pg (ref 26.0–34.0)
MCHC: 33.6 g/dL (ref 30.0–36.0)
MCV: 98.5 fL (ref 78.0–100.0)
PLATELETS: 220 10*3/uL (ref 150–400)
RBC: 3.96 MIL/uL (ref 3.87–5.11)
RDW: 13.2 % (ref 11.5–15.5)
WBC: 5.1 10*3/uL (ref 4.0–10.5)

## 2016-09-04 LAB — I-STAT TROPONIN, ED
TROPONIN I, POC: 0 ng/mL (ref 0.00–0.08)
TROPONIN I, POC: 0 ng/mL (ref 0.00–0.08)

## 2016-09-04 LAB — LIPASE, BLOOD: LIPASE: 32 U/L (ref 11–51)

## 2016-09-04 MED ORDER — TRAMADOL HCL 50 MG PO TABS: 50.0000 mg | ORAL_TABLET | Freq: Four times a day (QID) | ORAL | 0 refills | Status: DC | PRN

## 2016-09-04 NOTE — ED Notes (Signed)
In to speak to pt AND HUSBAND , they have sick dog and need to be with him,  Lab into get 2nd troponin

## 2016-09-04 NOTE — Discharge Instructions (Signed)
Follow up with your md next week. °

## 2016-09-04 NOTE — ED Triage Notes (Signed)
Pt reports right sided neck pain that started a week ago and has moved into her right shoulder and right side of her chest. Pt sent from urgent care.

## 2016-09-04 NOTE — ED Provider Notes (Signed)
[facility]-EMERGENCY DEPT Provider Note   CSN: [id number] Arrival date & time: 09/04/16  [id number]     History   Chief Complaint Chief Complaint  Patient presents with  . Chest Pain    HPI Monica Bailey is a 40 y.o. female.  Patient states that she's been having some chest discomfort and right-sided neck pain. She was seen today in urgent care and told come to the hospital   The history is provided by the patient. No language interpreter was used.  Chest Pain   This is a new problem. The current episode started more than 2 days ago. The problem occurs daily. The problem has not changed since onset.The pain is associated with movement. The pain is present in the lateral region. The pain is at a severity of 4/10. The pain is moderate. The quality of the pain is described as sharp. Radiates to: Pain in the right side of the neck. The symptoms are aggravated by certain positions. Pertinent negatives include no abdominal pain.    Past Medical History:  Diagnosis Date  . Abdominal pain, left lower quadrant 06/09/2010  . 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 Allergy   . 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 GERD 03/01/2009  . Headache(784.0) 03/01/2009  . Hiatal hernia   . IBS (irritable bowel syndrome)    worse in college  . Viral conjunctivitis    h/o left eye    Patient Active Problem List   Diagnosis Date Noted  . ABDOMINAL PAIN, LEFT LOWER QUADRANT 06/09/2010  . COUGH 09/22/2009  . GERD 03/01/2009  . HEADACHE 03/01/2009    Past Surgical History:  Procedure Laterality Date  . ABDOMINAL HYSTERECTOMY  12/09  . 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 ESOPHAGOGASTRODUODENOSCOPY  2014   [place] - [place]  . LEEP  12/08   10/98 and then 12/08 CIN II/III involving glandsand with positive endocervical margin    OB History    Gravida Para Term Preterm AB Living   0 0 0 0 0 0   SAB TAB Ectopic Multiple  Live Births   0 0 0 0         Home Medications    Prior to Admission medications   Medication Sig Start Date End Date Taking? Authorizing Provider  diphenhydrAMINE (BENADRYL) 25 mg capsule Take 25 mg by mouth every 6 (six) hours as needed for allergies.    Yes Historical Provider, MD  ibuprofen (ADVIL,MOTRIN) 200 MG tablet Take 200 mg by mouth every 6 (six) hours as needed for moderate pain.   Yes Historical Provider, MD  Naproxen Sodium 220 MG CAPS Take 220 mg by mouth every 12 (twelve) hours as needed (pain).    Yes Historical Provider, MD  norethindrone-ethinyl estradiol (BLISOVI FE 1/20) 1-20 MG-MCG tablet Take 1 tablet by mouth daily. 07/14/16  Yes [name], MD  Probiotic Product (ALIGN PO) Take 1 capsule by mouth daily.   Yes Historical Provider, MD  RABEprazole (ACIPHEX) 20 MG tablet TAKE 1 TABLET (20 MG TOTAL) BY MOUTH DAILY. GENERIC NECESSARY Patient taking differently: TAKE 1 TABLET (20 MG TOTAL) BY MOUTH DAILY AS NEEDED. GENERIC NECESSARY 08/28/16  Yes Kristian CoveyBruce W Burchette, MD  Wheat Dextrin (BENEFIBER PO) Take 1 packet by mouth daily as needed (supplement).   Yes Historical Provider, MD  traMADol (ULTRAM) 50 MG tablet Take 1 tablet (50 mg total) by mouth every  6 (six) hours as needed. 09/04/16   Bethann BerkshireJoseph Alizah Sills, MD    Family History Family History  Problem Relation Age of Onset  . Arthritis Other   . Alcohol abuse Other   . Cancer Other     lung  . Heart disease Other   . Hypertension Other     maternal side  . Diabetes Maternal Grandmother   . Osteoporosis Maternal Grandmother   . Hypertension Mother   . Colon polyps Mother   . Diverticulosis Mother   . Hypertension Father   . Diverticulosis Father   . Heart disease Maternal Grandfather     Social History Social History  Substance Use Topics  . Smoking status: Former Smoker    Packs/day: 1.00    Years: 6.00    Types: Cigarettes    Quit date: 02/12/1997  . Smokeless tobacco: Never Used  . Alcohol use 12.0 -  13.2 oz/week    12 - 14 Glasses of wine, 8 Standard drinks or equivalent per week     Comment: 12-14     Allergies   Penicillins   Review of Systems Review of Systems  Cardiovascular: Positive for chest pain.  Gastrointestinal: Negative for abdominal pain.     Physical Exam Updated Vital Signs BP 153/98   Pulse 75   Temp 98 F (36.7 C) (Oral)   Resp 16   Ht 5\' 6"  (1.676 m)   Wt 164 lb (74.4 kg)   LMP 10/17/2007 (Approximate)   SpO2 100%   BMI 26.47 kg/m   Physical Exam  Constitutional: She is oriented to person, place, and time. She appears well-developed.  HENT:  Head: Normocephalic.  Mild tenderness to right-sided neck  Eyes: Conjunctivae and EOM are normal. No scleral icterus.  Neck: Neck supple. No thyromegaly present.  Cardiovascular: Normal rate and regular rhythm.  Exam reveals no gallop and no friction rub.   No murmur heard. Pulmonary/Chest: No stridor. She has no wheezes. She has no rales. She exhibits tenderness.  Abdominal: She exhibits no distension. There is no tenderness. There is no rebound.  Musculoskeletal: Normal range of motion. She exhibits no edema.  Lymphadenopathy:    She has no cervical adenopathy.  Neurological: She is oriented to person, place, and time. She exhibits normal muscle tone. Coordination normal.  Skin: No rash noted. No erythema.  Psychiatric: She has a normal mood and affect. Her behavior is normal.     ED Treatments / Results  Labs (all labs ordered are listed, but only abnormal results are displayed) Labs Reviewed  BASIC METABOLIC PANEL - Abnormal; Notable for the following:       Result Value   Glucose, Bld 100 (*)    All other components within normal limits  HEPATIC FUNCTION PANEL - Abnormal; Notable for the following:    AST 46 (*)    Alkaline Phosphatase 23 (*)    Indirect Bilirubin 1.0 (*)    All other components within normal limits  CBC  LIPASE, BLOOD  I-STAT TROPOININ, ED  I-STAT TROPOININ, ED     EKG  EKG Interpretation  Date/Time:  Monday September 04 2016 09:47:49 EST Ventricular Rate:  77 PR Interval:  142 QRS Duration: 90 QT Interval:  376 QTC Calculation: 425 R Axis:   93 Text Interpretation:  Normal sinus rhythm Rightward axis Borderline ECG Confirmed by Branko Steeves  MD, Montel Vanderhoof [phone]([id number]) on 09/04/2016 10:13:33 AM       Radiology Dg Chest 2 View  Result Date: 09/04/2016 CLINICAL DATA:  Right chest pain, neck pain EXAM: CHEST  2 VIEW COMPARISON:  None. FINDINGS: The heart size and mediastinal contours are within normal limits. Both lungs are clear. The visualized skeletal structures are unremarkable. IMPRESSION: No active cardiopulmonary disease. Electronically Signed   By: Elige Ko   On: 09/04/2016 10:42    Procedures Procedures (including critical care time)  Medications Ordered in ED Medications - No data to display   Initial Impression / Assessment and Plan / ED Course  I have reviewed the triage vital signs and the nursing notes.  Pertinent labs & imaging results that were available during my care of the patient were reviewed by me and considered in my medical decision making (see chart for details).  Clinical Course     Patient had 2 troponins were normal EKG shows no acute disease. Rest of labs and chest x-ray unremarkable. Suspect this is musculoskeletal pain. Patient given some Ultram will follow-up with the PCP also her blood pressure has been moderately elevated and she'll have that rechecked  Final Clinical Impressions(s) / ED Diagnoses   Final diagnoses:  Chest wall pain    New Prescriptions New Prescriptions   TRAMADOL (ULTRAM) 50 MG TABLET    Take 1 tablet (50 mg total) by mouth every 6 (six) hours as needed.     Bethann Berkshire, MD 09/04/16 1320

## 2016-09-04 NOTE — ED Notes (Signed)
Rt neck pain that spread to rt under arm started yesterday then it wwwent to her chest pain came and went went to [facility] had an ekg and was advised to come to er, has been going to pt for pelvic floor exersizes and got massage on neck on Friday rt arm felt heavy yesterday

## 2016-09-13 ENCOUNTER — Ambulatory Visit (INDEPENDENT_AMBULATORY_CARE_PROVIDER_SITE_OTHER): Payer: BLUE CROSS/BLUE SHIELD | Admitting: Family Medicine

## 2016-09-13 VITALS — BP 136/80 | HR 78 | Temp 98.2°F | Ht 66.0 in | Wt 165.0 lb

## 2016-09-13 DIAGNOSIS — R0789 Other chest pain: Secondary | ICD-10-CM

## 2016-09-13 DIAGNOSIS — M549 Dorsalgia, unspecified: Secondary | ICD-10-CM | POA: Diagnosis not present

## 2016-09-13 NOTE — Progress Notes (Signed)
Pre visit review using our clinic review tool, if applicable. No additional management support is needed unless otherwise documented below in the visit note. 

## 2016-09-13 NOTE — Progress Notes (Signed)
Subjective:     Patient ID: Monica Bailey, female   DOB: 07/03/1976, 40 y.o.   MRN: [id number]  HPI Patient seen for recent ER follow-up. Recent history is that she's been having for quite some time some nonspecific lower abdominal and pelvic pain. She's had extensive workup. Recent colonoscopy which showed diverticulosis. Gynecologist had done pelvic ultrasound and her ovaries were normal. She was sent to urology to evaluate for possible interstitial cystitis. No cystoscopy done. Was felt that she had some pelvic floor pain and was prescribed low-dose amitriptyline but only took this one dose and did not like the way this made she her feel with some drowsiness the next day. She was referred for some pelvic floor physical therapy.  Abdominal/pelvic pain currently stable.  Patient was seen in the ER on 09/04/16 with some right-sided neck pain and chest discomfort. No exertional symptoms. She states the pain is worse with movement and moderate severity. She described this as sharp pain. No radiculopathy symptoms. Denied any upper extremity numbness or weakness. Troponins were negative. EKG unremarkable. Blood pressure slightly elevated in the ER. No prior history of hypertension. Chest x-ray was unremarkable.  Patient relies that she has some chronic anxiety symptoms and she feels that when she has physical symptoms these tend to be amplified. She has not had any history of panic disorder. She's trying to get back into some more consistent exercise and yoga which is helped in the past. Denies cough. No pleuritic pain.  Past Medical History:  Diagnosis Date  . Abdominal pain, left lower quadrant 06/09/2010  . 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 Allergy   . 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 GERD 03/01/2009  . Headache(784.0) 03/01/2009  . Hiatal hernia   . IBS (irritable bowel syndrome)    worse in college  . Viral conjunctivitis     h/o left eye   Past Surgical History:  Procedure Laterality Date  . ABDOMINAL HYSTERECTOMY  12/09  . 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 ESOPHAGOGASTRODUODENOSCOPY  2014   [place] - [place]  . LEEP  12/08   10/98 and then 12/08 CIN II/III involving glandsand with positive endocervical margin    reports that she quit smoking about 19 years ago. Her smoking use included Cigarettes. She has a 6.00 pack-year smoking history. She has never used smokeless tobacco. She reports that she drinks about 12.0 - 13.2 oz of alcohol per week . She reports that she does not use drugs. family history includes Alcohol abuse in her other; Arthritis in her other; Cancer in her other; Colon polyps in her mother; Diabetes in her maternal grandmother; Diverticulosis in her father and mother; Heart disease in her maternal grandfather and other; Hypertension in her father, mother, and other; Osteoporosis in her maternal grandmother. Allergies  Allergen Reactions  . Penicillins      Review of Systems  Constitutional: Negative for appetite change, chills, fever and unexpected weight change.  Eyes: Negative for visual disturbance.  Respiratory: Negative for shortness of breath.   Cardiovascular: Positive for chest pain. Negative for palpitations and leg swelling.  Gastrointestinal: Negative for abdominal pain.  Genitourinary: Negative for dysuria.  Neurological: Negative for dizziness.       Objective:   Physical Exam  Constitutional: She appears well-developed and well-nourished. No distress.  Neck: Neck supple. No thyromegaly present.  Cardiovascular: Normal rate and regular rhythm.  Pulmonary/Chest: Effort normal and breath sounds normal. No respiratory distress. She has no wheezes. She has no rales.  Musculoskeletal:  Patient some mild trapezius tenderness. Full range of motion cervical spine.  Lymphadenopathy:    She has no cervical adenopathy.  Neurological:  Full strength upper  extremities       Assessment:     #1 recent atypical chest pain with unremarkable ER evaluation.  Low suspicion for cardiac etiology  #2 chronic underlying anxiety. Doubt panic attack    Plan:     -We discussed nonpharmacologic measures to help manage anxiety. Make sure getting plenty of sleep. Get back to more consistent exercise. Minimize caffeine use. She will start back doing some yoga. Offered counseling and she is not interested at this time -Continue high fiber diet with her history of diverticulosis -Follow-up promptly for any exertional chest pain or dyspnea  Kristian CoveyBruce W Imojean Yoshino MD [facility]

## 2016-09-20 ENCOUNTER — Ambulatory Visit: Payer: BLUE CROSS/BLUE SHIELD | Admitting: Family Medicine

## 2016-09-25 ENCOUNTER — Encounter: Payer: Self-pay | Admitting: Family Medicine

## 2016-09-27 DIAGNOSIS — R102 Pelvic and perineal pain: Secondary | ICD-10-CM | POA: Diagnosis not present

## 2016-09-27 DIAGNOSIS — M62838 Other muscle spasm: Secondary | ICD-10-CM | POA: Diagnosis not present

## 2016-09-27 DIAGNOSIS — M6281 Muscle weakness (generalized): Secondary | ICD-10-CM | POA: Diagnosis not present

## 2016-09-27 DIAGNOSIS — R3915 Urgency of urination: Secondary | ICD-10-CM | POA: Diagnosis not present

## 2016-09-27 NOTE — Addendum Note (Signed)
Addended by: Kristian CoveyBURCHETTE, [place] Devine W on: 09/27/2016 07:23 AM   Modules accepted: Orders

## 2016-10-04 DIAGNOSIS — M6281 Muscle weakness (generalized): Secondary | ICD-10-CM | POA: Diagnosis not present

## 2016-10-04 DIAGNOSIS — R102 Pelvic and perineal pain: Secondary | ICD-10-CM | POA: Diagnosis not present

## 2016-10-04 DIAGNOSIS — K5900 Constipation, unspecified: Secondary | ICD-10-CM | POA: Diagnosis not present

## 2016-10-04 DIAGNOSIS — M62838 Other muscle spasm: Secondary | ICD-10-CM | POA: Diagnosis not present

## 2016-10-19 ENCOUNTER — Telehealth (HOSPITAL_COMMUNITY): Payer: Self-pay | Admitting: *Deleted

## 2016-10-19 NOTE — Telephone Encounter (Signed)
Left message on voicemail per DPR in reference to upcoming appointment scheduled on 10/25/16 at 7:30 with detailed instructions given per Stress Test Requisition Sheet for the test. LM to arrive 30 minutes early, and that it is imperative to arrive on time for appointment to keep from having the test rescheduled. If you need to cancel or reschedule your appointment, please call the office within 24 hours of your appointment. Failure to do so may result in a cancellation of your appointment, and a $50 no show fee. Phone number given for call back for any questions. Daneil DolinSharon S Brooks

## 2016-10-25 ENCOUNTER — Other Ambulatory Visit (HOSPITAL_COMMUNITY): Payer: BLUE CROSS/BLUE SHIELD

## 2016-11-03 ENCOUNTER — Telehealth (HOSPITAL_COMMUNITY): Payer: Self-pay | Admitting: *Deleted

## 2016-11-03 NOTE — Telephone Encounter (Signed)
Patient given instructions for upcoming stress echo 11/09/16.  Monica Bailey, [name] [name]

## 2016-11-09 ENCOUNTER — Ambulatory Visit (HOSPITAL_COMMUNITY): Payer: BLUE CROSS/BLUE SHIELD | Attending: Internal Medicine

## 2016-11-09 ENCOUNTER — Ambulatory Visit (HOSPITAL_COMMUNITY): Payer: BLUE CROSS/BLUE SHIELD

## 2016-11-09 DIAGNOSIS — R0789 Other chest pain: Secondary | ICD-10-CM | POA: Insufficient documentation

## 2016-11-16 ENCOUNTER — Ambulatory Visit (INDEPENDENT_AMBULATORY_CARE_PROVIDER_SITE_OTHER): Payer: BLUE CROSS/BLUE SHIELD | Admitting: Psychology

## 2016-11-16 DIAGNOSIS — F411 Generalized anxiety disorder: Secondary | ICD-10-CM

## 2016-11-29 ENCOUNTER — Ambulatory Visit (INDEPENDENT_AMBULATORY_CARE_PROVIDER_SITE_OTHER): Payer: BLUE CROSS/BLUE SHIELD | Admitting: Psychology

## 2016-11-29 DIAGNOSIS — F419 Anxiety disorder, unspecified: Secondary | ICD-10-CM | POA: Diagnosis not present

## 2016-12-12 DIAGNOSIS — Z1231 Encounter for screening mammogram for malignant neoplasm of breast: Secondary | ICD-10-CM | POA: Diagnosis not present

## 2016-12-13 ENCOUNTER — Ambulatory Visit (INDEPENDENT_AMBULATORY_CARE_PROVIDER_SITE_OTHER): Payer: BLUE CROSS/BLUE SHIELD | Admitting: Psychology

## 2016-12-13 DIAGNOSIS — F419 Anxiety disorder, unspecified: Secondary | ICD-10-CM

## 2016-12-19 ENCOUNTER — Encounter: Payer: Self-pay | Admitting: Obstetrics & Gynecology

## 2016-12-20 ENCOUNTER — Ambulatory Visit: Payer: BLUE CROSS/BLUE SHIELD | Admitting: Psychology

## 2016-12-26 ENCOUNTER — Ambulatory Visit: Payer: Self-pay | Admitting: Family Medicine

## 2016-12-27 ENCOUNTER — Telehealth: Payer: Self-pay | Admitting: Family Medicine

## 2016-12-27 ENCOUNTER — Ambulatory Visit: Payer: BLUE CROSS/BLUE SHIELD | Admitting: Psychology

## 2016-12-27 ENCOUNTER — Encounter: Payer: Self-pay | Admitting: Family Medicine

## 2016-12-27 ENCOUNTER — Other Ambulatory Visit: Payer: Self-pay | Admitting: *Deleted

## 2016-12-27 MED ORDER — ALPRAZOLAM 0.25 MG PO TABS: 0.2500 mg | ORAL_TABLET | Freq: Three times a day (TID) | ORAL | 0 refills | Status: DC | PRN

## 2016-12-27 NOTE — Telephone Encounter (Signed)
I have answered this call in patients calls encounter.

## 2016-12-27 NOTE — Telephone Encounter (Signed)
° ° ° °  I offered pt a appt she decline

## 2016-12-27 NOTE — Telephone Encounter (Signed)
° ° °  Pt  Call to say that Dr Caryl NeverBurchette is aware of her situation and is asking if he will rx her xanax and she need it this morning    [facility] [address]

## 2017-01-03 ENCOUNTER — Ambulatory Visit: Payer: BLUE CROSS/BLUE SHIELD | Admitting: Psychology

## 2017-01-05 ENCOUNTER — Ambulatory Visit (INDEPENDENT_AMBULATORY_CARE_PROVIDER_SITE_OTHER): Payer: BLUE CROSS/BLUE SHIELD | Admitting: Family Medicine

## 2017-01-05 VITALS — BP 120/74 | HR 77 | Temp 98.6°F | Ht 66.0 in | Wt 169.5 lb

## 2017-01-05 DIAGNOSIS — R079 Chest pain, unspecified: Secondary | ICD-10-CM | POA: Diagnosis not present

## 2017-01-05 NOTE — Progress Notes (Signed)
Subjective:     Patient ID: Monica Bailey, female   DOB: 02/17/1976, 41 y.o.   MRN: [id number]  HPI   Patient seen with intermittent episodes past couple weeks of chest pain and left-sided neck pain and intermittent numbness involving the left fifth finger and left arm. She is very concerned because her husband has had recent bypass. Patient's had some vague atypical chest pains over past year and had stress echo 2 months ago which was normal. Patient states her biologic father's hx is unknown. She is not aware of any obvious coronary artery disease history. Patient is nonsmoker. She has no history of hypertension or diabetes.  Current discomfort is 3 out of 10 intensity and she actually has some mild pain today in the room. She's not had associated dyspnea but she says she has frequent difficulty "getting a deep breath".  Past Medical History:  Diagnosis Date  . Abdominal pain, left lower quadrant 06/09/2010  . 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 Allergy   . 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 GERD 03/01/2009  . Headache(784.0) 03/01/2009  . Hiatal hernia   . IBS (irritable bowel syndrome)    worse in college  . Viral conjunctivitis    h/o left eye   Past Surgical History:  Procedure Laterality Date  . ABDOMINAL HYSTERECTOMY  12/09  . 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 ESOPHAGOGASTRODUODENOSCOPY  2014   [place] - [place]  . LEEP  12/08   10/98 and then 12/08 CIN II/III involving glandsand with positive endocervical margin    reports that she quit smoking about 19 years ago. Her smoking use included Cigarettes. She has a 6.00 pack-year smoking history. She has never used smokeless tobacco. She reports that she drinks about 12.0 - 13.2 oz of alcohol per week . She reports that she does not use drugs. family history includes Alcohol abuse in her other; Arthritis in her other; Cancer in her other; Colon  polyps in her mother; Diabetes in her maternal grandmother; Diverticulosis in her father and mother; Heart disease in her maternal grandfather and other; Hypertension in her father, mother, and other; Osteoporosis in her maternal grandmother. Allergies  Allergen Reactions  . Penicillins      Review of Systems  Constitutional: Negative for appetite change, fatigue and unexpected weight change.  Eyes: Negative for visual disturbance.  Respiratory: Negative for cough, chest tightness, shortness of breath and wheezing.   Cardiovascular: Positive for chest pain. Negative for palpitations and leg swelling.  Neurological: Negative for dizziness, seizures, syncope, weakness, light-headedness and headaches.       Objective:   Physical Exam  Constitutional: She appears well-developed and well-nourished.  Eyes: Pupils are equal, round, and reactive to light.  Neck: Neck supple. No JVD present. No thyromegaly present.  Cardiovascular: Normal rate and regular rhythm.  Exam reveals no gallop.   Pulmonary/Chest: Effort normal and breath sounds normal. No respiratory distress. She has no wheezes. She has no rales.  Musculoskeletal: She exhibits no edema.  Neurological: She is alert.       Assessment:     Ongoing atypical chest pain.  Symptoms occurring at rest and recent stress echo no ischemia.  Risk is relatively low.  Biologic father health unknown.  Suspect she has high degree of anxiety.  Her husband just had CABG.    EKG today shows NSR with no acute changes.  Plan:     We discussed further evaluation vs observation.  She is extremely anxious regarding her risk for CAD. With relatively low pretest probability and recent normal stress echo her risk is low.  We discussed getting CT coronary morphology/ coronary calcium score.     Kristian Covey MD [facility]

## 2017-01-05 NOTE — Patient Instructions (Signed)
We will set up CT coronary morphology study

## 2017-01-05 NOTE — Progress Notes (Signed)
Pre visit review using our clinic review tool, if applicable. No additional management support is needed unless otherwise documented below in the visit note. 

## 2017-01-08 ENCOUNTER — Ambulatory Visit (INDEPENDENT_AMBULATORY_CARE_PROVIDER_SITE_OTHER): Payer: BLUE CROSS/BLUE SHIELD | Admitting: Obstetrics & Gynecology

## 2017-01-08 ENCOUNTER — Encounter: Payer: Self-pay | Admitting: Obstetrics & Gynecology

## 2017-01-08 ENCOUNTER — Telehealth: Payer: Self-pay | Admitting: Obstetrics & Gynecology

## 2017-01-08 VITALS — Ht 66.0 in

## 2017-01-08 DIAGNOSIS — R200 Anesthesia of skin: Secondary | ICD-10-CM | POA: Diagnosis not present

## 2017-01-08 DIAGNOSIS — F4322 Adjustment disorder with anxiety: Secondary | ICD-10-CM | POA: Diagnosis not present

## 2017-01-08 DIAGNOSIS — R232 Flushing: Secondary | ICD-10-CM

## 2017-01-08 MED ORDER — ESCITALOPRAM OXALATE 10 MG PO TABS: ORAL_TABLET | ORAL | 1 refills | Status: DC

## 2017-01-08 NOTE — Telephone Encounter (Signed)
Spoke with patient. Patient states she has been noticing an increase in anxiety and hot flashes since Fall 2017. Patient states she has been evaluated by pcp and was advised nothing was wrong. Patient states she thinks she may be pre-menopausal, mother started menopause at 2838. Patient states symptoms are worse right now, possibly d/t spouse being sick. Patient reports partial hysterectomy in 2009, take OCP Blisovi daily. Patient denies any vaginal or urinary complaints. Recommended OV for further evaluation, patient scheduled for 3/26 at 3pm with Dr. Hyacinth MeekerMiller. Patient is agreeable to date and time.  Routing to provider for final review. Patient is agreeable to disposition. Will close encounter.

## 2017-01-08 NOTE — Progress Notes (Signed)
GYNECOLOGY  VISIT   HPI: 41 y.o. [id number] Married Caucasian female here for several issues.  First, she reports numbness in her left arm that goes down into her pinkie finger.  This starts as some neck pain and tightness in her chest.  Does not occur at night in bed and she never wakes up with the pain or numbness.  It always occurs during the work day.  It does not typically occur with exercise although she did discuss with Dr. Caryl NeverBurchette who ordered a stress test.  This was done 11/12/16 and this was normal.  She reports she is going to have a coronary CT for additional evaluation.  She reports she is having increased stressors.  Her husband had quadruple bypass on 12/28/16.  She does not know how much her anxiety is contributing to what she also feels is menopausal symptoms.  She feels "cloudy" at times.  GYNECOLOGIC HISTORY: Patient's last menstrual period was 10/17/2007 (approximate). Contraception: hysterectomy  Patient Active Problem List   Diagnosis Date Noted  . ABDOMINAL PAIN, LEFT LOWER QUADRANT 06/09/2010  . COUGH 09/22/2009  . GERD 03/01/2009  . HEADACHE 03/01/2009    Past Medical History:  Diagnosis Date  . Abdominal pain, left lower quadrant 06/09/2010  . 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 Allergy   . 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 GERD 03/01/2009  . Headache(784.0) 03/01/2009  . Hiatal hernia   . IBS (irritable bowel syndrome)    worse in college  . Viral conjunctivitis    h/o left eye    Past Surgical History:  Procedure Laterality Date  . ABDOMINAL HYSTERECTOMY  12/09  . 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 ESOPHAGOGASTRODUODENOSCOPY  2014   [place]  . LEEP  12/08   10/98 and then 12/08 CIN II/III involving glandsand with positive endocervical margin    MEDS:  Reviewed in EPIC and UTD  ALLERGIES: Penicillins  Family History  Problem Relation Age of Onset  .  Arthritis Other   . Alcohol abuse Other   . Cancer Other     lung  . Heart disease Other   . Hypertension Other     maternal side  . Diabetes Maternal Grandmother   . Osteoporosis Maternal Grandmother   . Hypertension Mother   . Colon polyps Mother   . Diverticulosis Mother   . Hypertension Father   . Diverticulosis Father   . Heart disease Maternal Grandfather     SH:  Married, non smoker  Review of Systems  Constitutional: Negative.   Cardiovascular: Positive for chest pain.  Psychiatric/Behavioral: The patient is nervous/anxious.     PHYSICAL EXAMINATION:    Ht 5\' 6"  (1.676 m)   LMP 10/17/2007 (Approximate)     General appearance: alert, cooperative and appears stated age  Chaperone was present for exam.  Assessment: Chest pain being evaluated by PCP Left arm pain with numbness down outside of arm Hot flashes Anxiety and stressors with husband's recent bypass surgery  Plan: Possible menopausal symptoms Possible adjustment disorder with anxiety symptoms Possible physical manifestation of stressors   ~20 minutes spent with patient >50% of time was in face to face discussion of above.

## 2017-01-08 NOTE — Telephone Encounter (Signed)
Patient would like to speak with a nurse.  She is not feeling well and thinks she may be having some menopausal symptoms.

## 2017-01-09 ENCOUNTER — Encounter: Payer: Self-pay | Admitting: Family Medicine

## 2017-01-09 ENCOUNTER — Other Ambulatory Visit: Payer: Self-pay | Admitting: Family Medicine

## 2017-01-09 DIAGNOSIS — R079 Chest pain, unspecified: Secondary | ICD-10-CM

## 2017-01-10 ENCOUNTER — Ambulatory Visit (INDEPENDENT_AMBULATORY_CARE_PROVIDER_SITE_OTHER): Payer: BLUE CROSS/BLUE SHIELD | Admitting: Psychology

## 2017-01-10 DIAGNOSIS — F419 Anxiety disorder, unspecified: Secondary | ICD-10-CM

## 2017-01-11 LAB — ANTI MULLERIAN HORMONE: AMH AssessR: 0.35 ng/mL

## 2017-01-15 ENCOUNTER — Telehealth: Payer: Self-pay | Admitting: *Deleted

## 2017-01-15 NOTE — Telephone Encounter (Signed)
-----   Message from Jerene Bears, MD sent at 01/15/2017 12:07 PM EDT ----- Please notify pt her AMH is low and is in early menopausal range.  Ok to stop her OCPs and start oral estradiol 1.0mg  or vivelle dot patches 0.1 mg patches to skin twice weekly.  I recommend she stay on the lexapro if she already started it.  If she is feeling better and doesn't want to change from the OCPs right now, that is ok.  Should recheck 1 month and can discuss further at that time.  Rx has not been sent the pharmacy.

## 2017-01-15 NOTE — Telephone Encounter (Signed)
Call to patient. Results reviewed with patient and she verbalized understanding. Patient states that she would like to not change anything and continue OCP's until OV with Dr. Hyacinth Meeker. Patient states she would like to research estradiol and vivelle dot patches before making a decision. Patient states she has already started lexapro and will continue taking. One month follow up scheduled for Thursday 02/08/17 at 1445. Patient agreeable to date and time of appointment.   Routing to provider for final review. Patient agreeable to disposition. Will close encounter.

## 2017-01-17 ENCOUNTER — Ambulatory Visit (INDEPENDENT_AMBULATORY_CARE_PROVIDER_SITE_OTHER)
Admission: RE | Admit: 2017-01-17 | Discharge: 2017-01-17 | Disposition: A | Discharge status: Home / Self Care | Payer: Self-pay | Source: Ambulatory Visit | Attending: Family Medicine | Admitting: Family Medicine

## 2017-01-17 DIAGNOSIS — M542 Cervicalgia: Secondary | ICD-10-CM | POA: Diagnosis not present

## 2017-01-17 DIAGNOSIS — R079 Chest pain, unspecified: Secondary | ICD-10-CM

## 2017-01-17 DIAGNOSIS — R0789 Other chest pain: Secondary | ICD-10-CM | POA: Diagnosis not present

## 2017-01-24 ENCOUNTER — Ambulatory Visit: Payer: BLUE CROSS/BLUE SHIELD | Admitting: Psychology

## 2017-01-24 DIAGNOSIS — M940 Chondrocostal junction syndrome [Tietze]: Secondary | ICD-10-CM | POA: Diagnosis not present

## 2017-01-31 ENCOUNTER — Ambulatory Visit: Payer: BLUE CROSS/BLUE SHIELD | Admitting: Psychology

## 2017-02-08 ENCOUNTER — Ambulatory Visit (INDEPENDENT_AMBULATORY_CARE_PROVIDER_SITE_OTHER): Payer: BLUE CROSS/BLUE SHIELD | Admitting: Obstetrics & Gynecology

## 2017-02-08 ENCOUNTER — Encounter: Payer: Self-pay | Admitting: Obstetrics & Gynecology

## 2017-02-08 VITALS — BP 118/80 | HR 78 | Resp 16 | Ht 66.0 in | Wt 168.6 lb

## 2017-02-08 DIAGNOSIS — N951 Menopausal and female climacteric states: Secondary | ICD-10-CM | POA: Diagnosis not present

## 2017-02-08 DIAGNOSIS — F4322 Adjustment disorder with anxiety: Secondary | ICD-10-CM | POA: Diagnosis not present

## 2017-02-08 DIAGNOSIS — R200 Anesthesia of skin: Secondary | ICD-10-CM | POA: Diagnosis not present

## 2017-02-08 MED ORDER — ESCITALOPRAM OXALATE 10 MG PO TABS: 10.0000 mg | ORAL_TABLET | Freq: Every day | ORAL | 12 refills | Status: DC

## 2017-02-08 NOTE — Progress Notes (Signed)
GYNECOLOGY  VISIT   HPI: 41 y.o. [id number] Married Caucasian female here for follow up of several issues she was seen for on 01/08/17.    1)  She was experiencing left arm numbness that went down her left arm.  She did see Dr. Charlett Blake and was treated with steroids.  This resolved symptoms but as soon as they were stopped, the pain started back.  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  She is feel much better.  2)  Atypical chest pain that was evaluated by a coronary CT.  This was low risk for cardiovascular disease.  She feels very reviewed by this.   3)  Anxiety with husband's recent cardiac surgery.  She is on Lexapro and this is really helping.  Husband is doing well too.  Doesn't want to make any changes at this point.  4.)  Possible menopausal symptoms confirmed with lab work.  She is on a combination OCP and does not want to make any changes due to personal stressors.  Wants to discuss options if decides to stop OCPs.  GYNECOLOGIC HISTORY: Patient's last menstrual period was 10/17/2007 (approximate). Contraception: hysterectomy  Menopausal hormone therapy: None  Patient Active Problem List   Diagnosis Date Noted  . ABDOMINAL PAIN, LEFT LOWER QUADRANT 06/09/2010  . COUGH 09/22/2009  . GERD 03/01/2009  . HEADACHE 03/01/2009    Past Medical History:  Diagnosis Date  . Abdominal pain, left lower quadrant 06/09/2010  . 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 Allergy   . 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 GERD 03/01/2009  . Headache(784.0) 03/01/2009  . Hiatal hernia   . IBS (irritable bowel syndrome)    worse in college  . Viral conjunctivitis    h/o left eye    Past Surgical History:  Procedure Laterality Date  . ABDOMINAL HYSTERECTOMY  12/09  . 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 ESOPHAGOGASTRODUODENOSCOPY  2014   [place] - [place]  . LEEP  12/08   10/98 and then 12/08 CIN II/III involving glandsand with positive endocervical margin    MEDS:  Reviewed in EPIC and UTD  ALLERGIES: Penicillins  Family History  Problem Relation Age of Onset  . Arthritis Other   . Alcohol abuse Other   . Cancer Other     lung  . Heart disease Other   . Hypertension Other     maternal side  . Diabetes Maternal Grandmother   . Osteoporosis Maternal Grandmother   . Hypertension Mother   . Colon polyps Mother   . Diverticulosis Mother   . Hypertension Father   . Diverticulosis Father   . Heart disease Maternal Grandfather    SH:  Married, non smoker  Review of Systems  Psychiatric/Behavioral: The patient is nervous/anxious (improved).   All other systems reviewed and are negative.   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  Rx  to pharmacy. Pt may stop OCPs and will let me know if has any symptoms.  If symptomatic, would transition to estradiol orally or in a patch and eliminate the progesterone. Return for AEX.   ~25 minutes spent with patient >50% of time was in face to face discussion of above.

## 2017-02-13 ENCOUNTER — Encounter: Payer: Self-pay | Admitting: Family Medicine

## 2017-02-22 ENCOUNTER — Other Ambulatory Visit: Payer: Self-pay

## 2017-02-22 MED ORDER — NORETHIN ACE-ETH ESTRAD-FE 1-20 MG-MCG PO TABS: 1.0000 | ORAL_TABLET | Freq: Every day | ORAL | 1 refills | Status: DC

## 2017-02-22 MED ORDER — ESCITALOPRAM OXALATE 10 MG PO TABS: 10.0000 mg | ORAL_TABLET | Freq: Every day | ORAL | 1 refills | Status: DC

## 2017-02-22 NOTE — Telephone Encounter (Signed)
Medication refill request: Blisovi & escitalopram 10mg  Last AEX:  07-14-16 Next AEX: 07-30-17 Last MMG (if hormonal medication request): 2/17 bilateral with left breast u/s neg Refill authorized: requesting 90 day supply of both meds. Please approve if appropriate

## 2017-02-26 ENCOUNTER — Other Ambulatory Visit: Payer: Self-pay | Admitting: *Deleted

## 2017-02-26 MED ORDER — RABEPRAZOLE SODIUM 20 MG PO TBEC: DELAYED_RELEASE_TABLET | ORAL | 1 refills | Status: DC

## 2017-03-06 ENCOUNTER — Encounter: Payer: Self-pay | Admitting: Family Medicine

## 2017-03-06 ENCOUNTER — Ambulatory Visit (INDEPENDENT_AMBULATORY_CARE_PROVIDER_SITE_OTHER): Payer: BLUE CROSS/BLUE SHIELD | Admitting: Family Medicine

## 2017-03-06 VITALS — BP 136/88 | HR 77 | Temp 98.3°F | Ht 67.0 in | Wt 168.2 lb

## 2017-03-06 DIAGNOSIS — Z0000 Encounter for general adult medical examination without abnormal findings: Secondary | ICD-10-CM

## 2017-03-06 DIAGNOSIS — Z23 Encounter for immunization: Secondary | ICD-10-CM | POA: Diagnosis not present

## 2017-03-06 LAB — BASIC METABOLIC PANEL
BUN: 11 mg/dL (ref 6–23)
CHLORIDE: 104 meq/L (ref 96–112)
CO2: 24 meq/L (ref 19–32)
Calcium: 9.1 mg/dL (ref 8.4–10.5)
Creatinine, Ser: 0.67 mg/dL (ref 0.40–1.20)
GFR: 102.57 mL/min (ref >60.00)
GLUCOSE: 75 mg/dL (ref 70–99)
POTASSIUM: 4 meq/L (ref 3.5–5.1)
SODIUM: 140 meq/L (ref 135–145)

## 2017-03-06 LAB — CBC WITH DIFFERENTIAL/PLATELET
BASOS PCT: 0.5 % (ref 0.0–3.0)
Basophils Absolute: 0 10*3/uL (ref 0.0–0.1)
EOS PCT: 0.4 % (ref 0.0–5.0)
Eosinophils Absolute: 0 10*3/uL (ref 0.0–0.7)
HCT: 37.2 % (ref 36.0–46.0)
Hemoglobin: 12.7 g/dL (ref 12.0–15.0)
LYMPHS ABS: 1 10*3/uL (ref 0.7–4.0)
Lymphocytes Relative: 19.1 % (ref 12.0–46.0)
MCHC: 34.1 g/dL (ref 30.0–36.0)
MCV: 98.8 fl (ref 78.0–100.0)
MONOS PCT: 6.8 % (ref 3.0–12.0)
Monocytes Absolute: 0.4 10*3/uL (ref 0.1–1.0)
NEUTROS ABS: 3.9 10*3/uL (ref 1.4–7.7)
NEUTROS PCT: 73.2 % (ref 43.0–77.0)
PLATELETS: 230 10*3/uL (ref 150.0–400.0)
RBC: 3.77 Mil/uL — AB (ref 3.87–5.11)
RDW: 14.8 % (ref 11.5–15.5)
WBC: 5.3 10*3/uL (ref 4.0–10.5)

## 2017-03-06 LAB — HEPATIC FUNCTION PANEL
ALBUMIN: 4.2 g/dL (ref 3.5–5.2)
ALT: 19 U/L (ref 0–35)
AST: 25 U/L (ref 0–37)
Alkaline Phosphatase: 19 U/L — ABNORMAL LOW (ref 39–117)
Bilirubin, Direct: 0.1 mg/dL (ref 0.0–0.3)
Total Bilirubin: 0.7 mg/dL (ref 0.2–1.2)
Total Protein: 7.4 g/dL (ref 6.0–8.3)

## 2017-03-06 LAB — LIPID PANEL
CHOLESTEROL: 183 mg/dL (ref 0–200)
HDL: 74.7 mg/dL (ref >39.00)
LDL Cholesterol: 80 mg/dL (ref 0–99)
NonHDL: 108.71
TRIGLYCERIDES: 146 mg/dL (ref 0.0–149.0)
Total CHOL/HDL Ratio: 2
VLDL: 29.2 mg/dL (ref 0.0–40.0)

## 2017-03-06 LAB — TSH: TSH: 1.46 u[IU]/mL (ref 0.35–4.50)

## 2017-03-06 NOTE — Patient Instructions (Signed)
Monitor blood pressure at home and be in touch if consistently > 140/90 Keep wine intake < 8 ounces per day Keep sodium intake < 2500 mg per day. Avoid Meloxicam as much as possible.

## 2017-03-06 NOTE — Progress Notes (Signed)
Subjective:     Patient ID: Monica Bailey, female   DOB: 06/10/1976, 41 y.o.   MRN: [id number]  HPI Patient for physical exam. She had some atypical chest pain last year with unremarkable workup. She feels in retrospect this was at least partly anxiety related. Her husband did have bypass surgery few months ago and is doing well.  She's had history of some costochondritis type symptoms and takes meloxicam intermittently. Nonsmoker. Drinks about 2 glasses of wine per night. Does have some recent elevated blood pressures. Positive family history of hypertension. She walks for exercise. Needs tetanus booster.  She sees gynecologist yearly.  Past Medical History:  Diagnosis Date  . Abdominal pain, left lower quadrant 06/09/2010  . 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 Allergy   . 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 GERD 03/01/2009  . Headache(784.0) 03/01/2009  . Hiatal hernia   . IBS (irritable bowel syndrome)    worse in college  . Viral conjunctivitis    h/o left eye   Past Surgical History:  Procedure Laterality Date  . ABDOMINAL HYSTERECTOMY  12/09  . 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 ESOPHAGOGASTRODUODENOSCOPY  2014   [place] - [place]  . LEEP  12/08   10/98 and then 12/08 CIN II/III involving glandsand with positive endocervical margin    reports that she quit smoking about 20 years ago. Her smoking use included Cigarettes. She has a 6.00 pack-year smoking history. She has never used smokeless tobacco. She reports that she drinks about 12.0 - 13.2 oz of alcohol per week . She reports that she does not use drugs. family history includes Alcohol abuse in her other; Arthritis in her other; Cancer in her other; Colon polyps in her mother; Diabetes in her maternal grandmother; Diverticulosis in her father and mother; Heart disease in her maternal grandfather and other; Hypertension in her father, mother,  and other; Osteoporosis in her maternal grandmother. Allergies  Allergen Reactions  . Penicillins      Review of Systems  Constitutional: Negative for activity change, appetite change, fatigue, fever and unexpected weight change.  HENT: Negative for ear pain, hearing loss, sore throat and trouble swallowing.   Eyes: Negative for visual disturbance.  Respiratory: Negative for cough and shortness of breath.   Cardiovascular: Negative for chest pain and palpitations.  Gastrointestinal: Negative for abdominal pain, blood in stool, constipation and diarrhea.  Genitourinary: Negative for dysuria and hematuria.  Musculoskeletal: Negative for arthralgias, back pain and myalgias.  Skin: Negative for rash.  Neurological: Negative for dizziness, syncope and headaches.  Hematological: Negative for adenopathy.  Psychiatric/Behavioral: Negative for confusion and dysphoric mood.       Objective:   Physical Exam  Constitutional: She is oriented to person, place, and time. She appears well-developed and well-nourished.  HENT:  Head: Normocephalic and atraumatic.  Eyes: EOM are normal. Pupils are equal, round, and reactive to light.  Neck: Normal range of motion. Neck supple. No thyromegaly present.  Cardiovascular: Normal rate, regular rhythm and normal heart sounds.   No murmur heard. Pulmonary/Chest: Breath sounds normal. No respiratory distress. She has no wheezes. She has no rales.  Abdominal: Soft. Bowel sounds are normal. She exhibits no distension and no mass. There is no tenderness. There is no rebound and no guarding.  Genitourinary:  Genitourinary Comments: Per GYN  Musculoskeletal: Normal range of motion. She exhibits no edema.  Lymphadenopathy:    She has no cervical adenopathy.  Neurological: She is alert and oriented to person, place, and time. She displays normal reflexes. No cranial nerve deficit.  Skin: No rash noted.  Psychiatric: She has a normal mood and affect. Her  behavior is normal. Judgment and thought content normal.       Assessment:     Physical exam. She had initial borderline elevated blood pressure slightly improved at repeat. Blood pressure may be exacerbated by wine consumption- about 12 ounces per day and nonsteroidal use with meloxicam    Plan:     -Try to avoid nonsteroidals as much as possible -Reduce wine to 6-8 ounces per day -Tetanus booster given -Screening labs obtained -Regular aerobic exercise -Monitor blood pressure been touch if consistently > 140/90 -keep sodium intake < 2,500 mg per day.  Kristian Covey MD [facility] at [facility]

## 2017-03-09 ENCOUNTER — Encounter: Payer: Self-pay | Admitting: Obstetrics & Gynecology

## 2017-03-27 DIAGNOSIS — R0789 Other chest pain: Secondary | ICD-10-CM | POA: Diagnosis not present

## 2017-03-27 DIAGNOSIS — M25512 Pain in left shoulder: Secondary | ICD-10-CM | POA: Diagnosis not present

## 2017-04-01 DIAGNOSIS — M542 Cervicalgia: Secondary | ICD-10-CM | POA: Diagnosis not present

## 2017-04-07 DIAGNOSIS — M25512 Pain in left shoulder: Secondary | ICD-10-CM | POA: Diagnosis not present

## 2017-04-17 ENCOUNTER — Encounter: Payer: Self-pay | Admitting: Obstetrics & Gynecology

## 2017-04-24 DIAGNOSIS — M25512 Pain in left shoulder: Secondary | ICD-10-CM | POA: Diagnosis not present

## 2017-05-09 ENCOUNTER — Encounter: Payer: Self-pay | Admitting: Family Medicine

## 2017-05-09 ENCOUNTER — Ambulatory Visit (INDEPENDENT_AMBULATORY_CARE_PROVIDER_SITE_OTHER): Payer: BLUE CROSS/BLUE SHIELD | Admitting: Family Medicine

## 2017-05-09 VITALS — BP 120/88 | HR 76 | Temp 98.4°F | Wt 172.3 lb

## 2017-05-09 DIAGNOSIS — J209 Acute bronchitis, unspecified: Secondary | ICD-10-CM

## 2017-05-09 MED ORDER — HYDROCODONE-HOMATROPINE 5-1.5 MG/5ML PO SYRP: 5.0000 mL | ORAL_SOLUTION | Freq: Four times a day (QID) | ORAL | 0 refills | Status: AC | PRN

## 2017-05-09 NOTE — Patient Instructions (Signed)

## 2017-05-09 NOTE — Progress Notes (Signed)
Subjective:     Patient ID: Monica Bailey, female   DOB: 07/26/1976, 41 y.o.   MRN: [id number]  HPI Patient is nonsmoker who is seen with 2 week history of mostly dry cough. She has had some mild sore throat. Has some nasal congestion and initially felt this was more allergic. No fevers or chills. No sick contacts. No GERD symptoms. Took some Benadryl at night without much relief. Cough is waking her up some at night. No wheezing. Denies any shortness of breath or pleuritic pain.  Past Medical History:  Diagnosis Date  . Abdominal pain, left lower quadrant 06/09/2010  . 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 Allergy   . 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 GERD 03/01/2009  . Headache(784.0) 03/01/2009  . Hiatal hernia   . IBS (irritable bowel syndrome)    worse in college  . Viral conjunctivitis    h/o left eye   Past Surgical History:  Procedure Laterality Date  . ABDOMINAL HYSTERECTOMY  12/09  . 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 ESOPHAGOGASTRODUODENOSCOPY  2014   [place] - [place]  . LEEP  12/08   10/98 and then 12/08 CIN II/III involving glandsand with positive endocervical margin    reports that she quit smoking about 20 years ago. Her smoking use included Cigarettes. She has a 6.00 pack-year smoking history. She has never used smokeless tobacco. She reports that she drinks about 12.0 - 13.2 oz of alcohol per week . She reports that she does not use drugs. family history includes Alcohol abuse in her other; Arthritis in her other; Cancer in her other; Colon polyps in her mother; Diabetes in her maternal grandmother; Diverticulosis in her father and mother; Heart disease in her maternal grandfather and other; Hypertension in her father, mother, and other; Osteoporosis in her maternal grandmother. Allergies  Allergen Reactions  . Penicillins      Review of Systems  Constitutional: Negative for  chills and fever.  HENT: Positive for sore throat.   Respiratory: Positive for cough. Negative for shortness of breath and wheezing.   Cardiovascular: Negative for chest pain.       Objective:   Physical Exam  Constitutional: She appears well-developed and well-nourished.  HENT:  Right Ear: External ear normal.  Left Ear: External ear normal.  Mouth/Throat: Oropharynx is clear and moist.  Neck: Neck supple.  Cardiovascular: Normal rate and regular rhythm.   Pulmonary/Chest: Effort normal and breath sounds normal. No respiratory distress. She has no wheezes. She has no rales.  Lymphadenopathy:    She has no cervical adenopathy.       Assessment:     Cough. Suspect acute viral bronchitis. Nonfocal exam    Plan:     -Reminder to avoid dextromethorphan containing over-the-counter cough medications with her Lexapro -Hycodan cough syrup 1 teaspoon daily at bedtime for severe cough -Follow-up for any fever or any worsening or persistent cough.  Kristian CoveyBruce W Amir Fick MD [facility]

## 2017-05-28 IMAGING — DX DG CHEST 2V
2 series · 2 of 2 positions shown · non-contrast
Comparison: None.

CLINICAL DATA: Right chest pain, neck pain

EXAM:
CHEST  2 VIEW

[w chest pa]
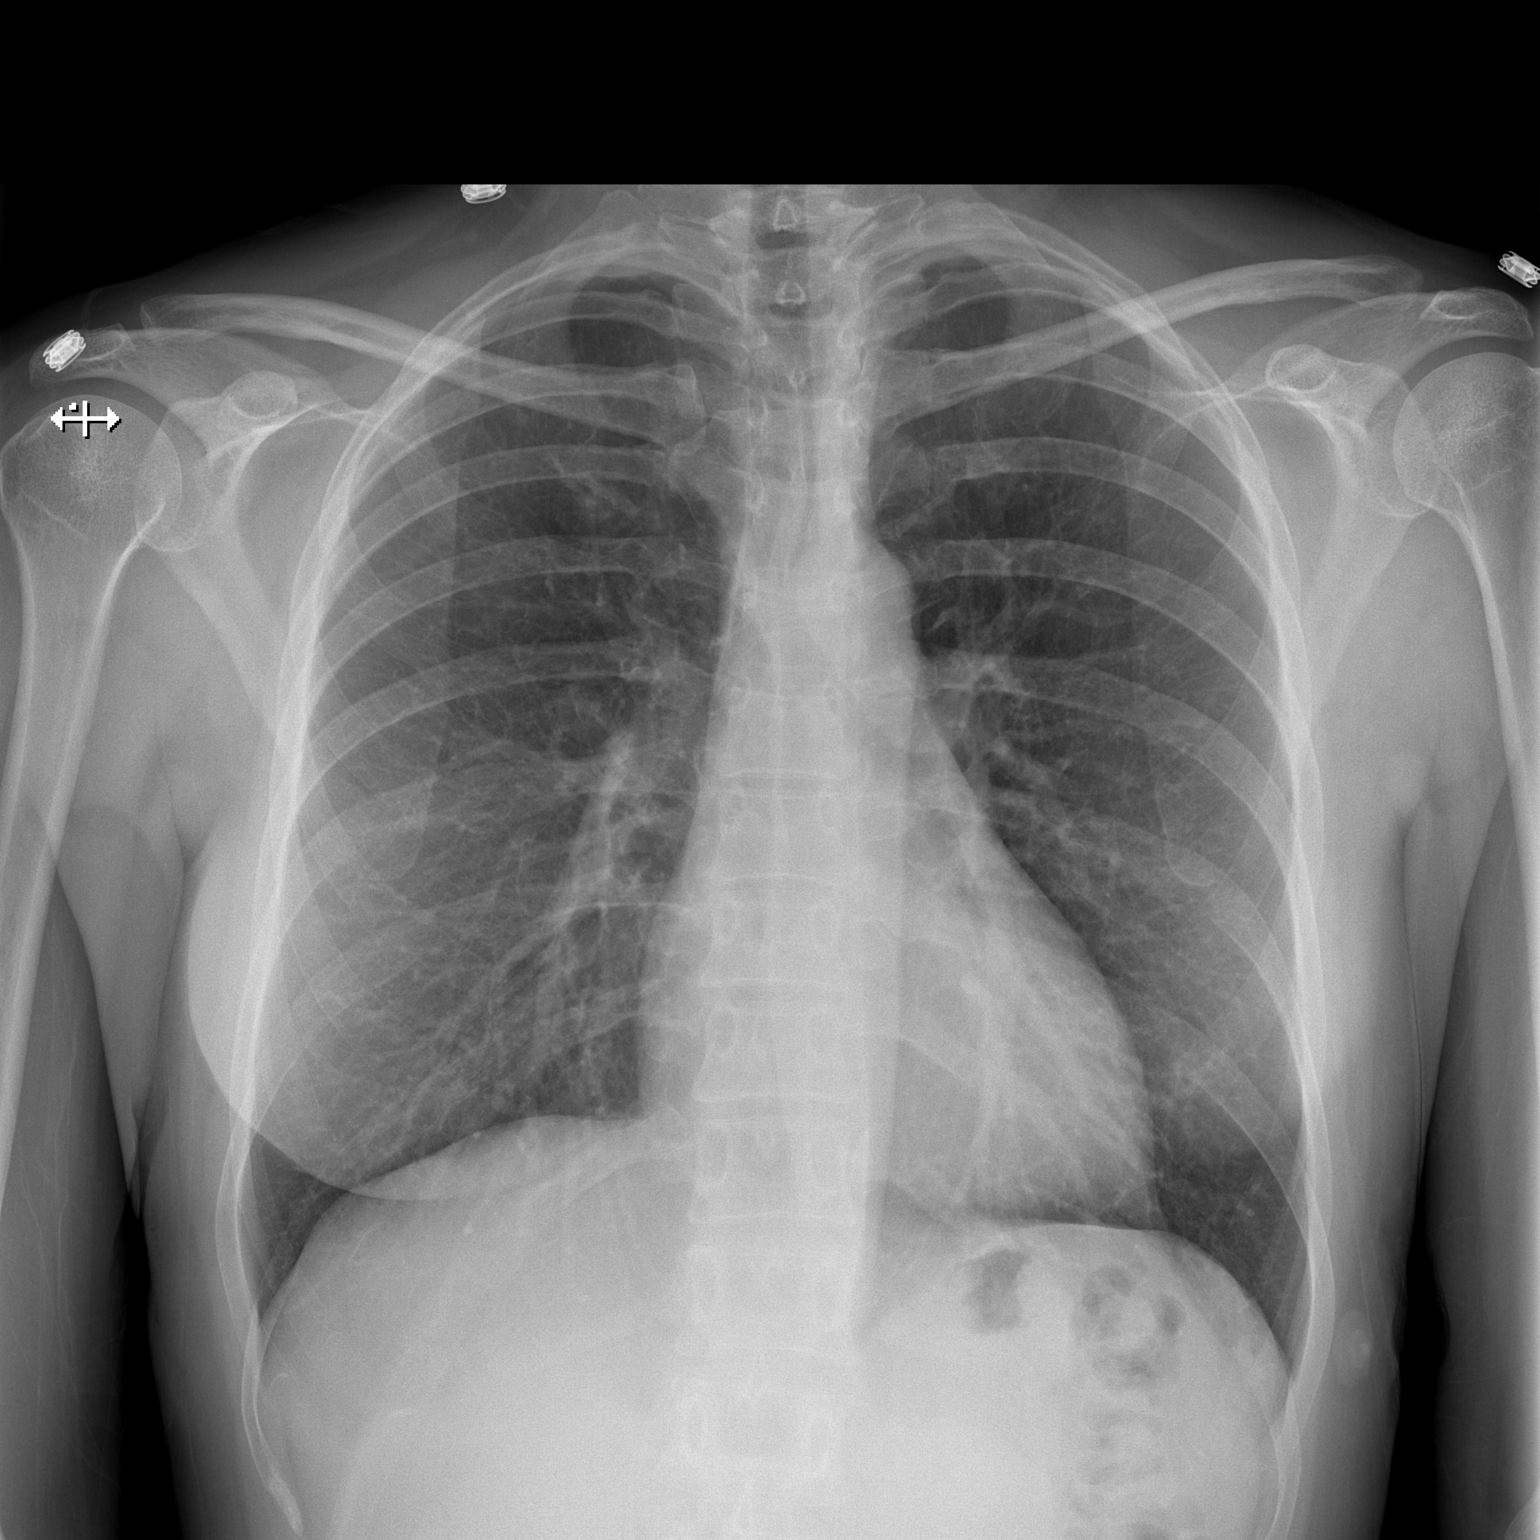

[w chest lat]
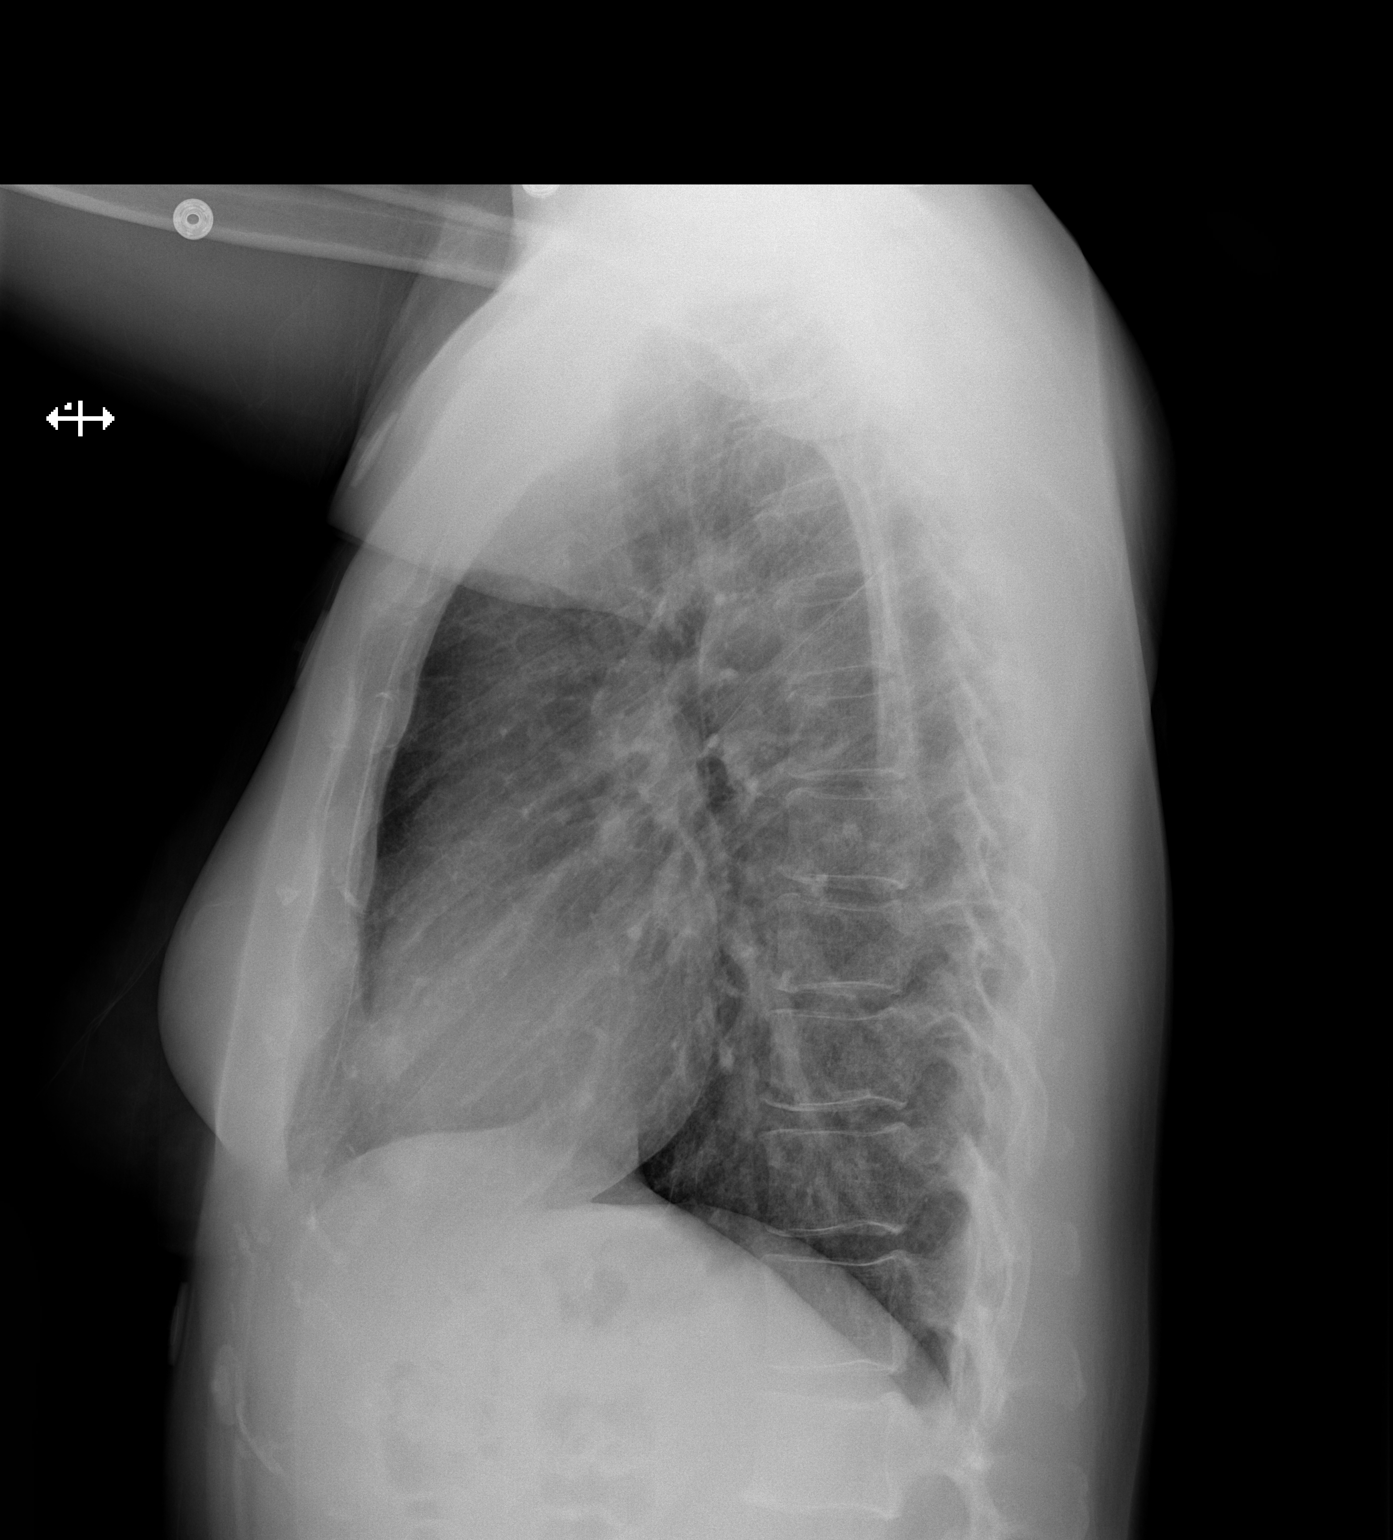

[2 of 2 positions shown; findings below may reference images not displayed]

FINDINGS: The heart size and mediastinal contours are within normal limits.
Both lungs are clear. The visualized skeletal structures are
unremarkable.
IMPRESSION: No active cardiopulmonary disease.

## 2017-06-15 DIAGNOSIS — M461 Sacroiliitis, not elsewhere classified: Secondary | ICD-10-CM | POA: Diagnosis not present

## 2017-06-20 ENCOUNTER — Ambulatory Visit: Payer: Self-pay | Admitting: Family Medicine

## 2017-06-22 ENCOUNTER — Encounter: Payer: Self-pay | Admitting: Family Medicine

## 2017-06-22 ENCOUNTER — Ambulatory Visit (INDEPENDENT_AMBULATORY_CARE_PROVIDER_SITE_OTHER): Payer: BLUE CROSS/BLUE SHIELD | Admitting: Family Medicine

## 2017-06-22 VITALS — BP 134/90 | HR 69 | Temp 98.0°F | Wt 171.6 lb

## 2017-06-22 DIAGNOSIS — R059 Cough, unspecified: Secondary | ICD-10-CM

## 2017-06-22 DIAGNOSIS — R05 Cough: Secondary | ICD-10-CM

## 2017-06-22 DIAGNOSIS — Z23 Encounter for immunization: Secondary | ICD-10-CM | POA: Diagnosis not present

## 2017-06-22 NOTE — Patient Instructions (Signed)
Food Choices for Gastroesophageal Reflux Disease, Adult When you have gastroesophageal reflux disease (GERD), the foods you eat and your eating habits are very important. Choosing the right foods can help ease the discomfort of GERD. Consider working with a diet and nutrition specialist (dietitian) to help you make healthy food choices. What general guidelines should I follow? Eating plan  Choose healthy foods low in fat, such as fruits, vegetables, whole grains, low-fat dairy products, and lean meat, fish, and poultry.  Eat frequent, small meals instead of three large meals each day. Eat your meals slowly, in a relaxed setting. Avoid bending over or lying down until 2-3 hours after eating.  Limit high-fat foods such as fatty meats or fried foods.  Limit your intake of oils, butter, and shortening to less than 8 teaspoons each day.  Avoid the following: ? Foods that cause symptoms. These may be different for different people. Keep a food diary to keep track of foods that cause symptoms. ? Alcohol. ? Drinking large amounts of liquid with meals. ? Eating meals during the 2-3 hours before bed.  Cook foods using methods other than frying. This may include baking, grilling, or broiling. Lifestyle   Maintain a healthy weight. Ask your health care provider what weight is healthy for you. If you need to lose weight, work with your health care provider to do so safely.  Exercise for at least 30 minutes on 5 or more days each week, or as told by your health care provider.  Avoid wearing clothes that fit tightly around your waist and chest.  Do not use any products that contain nicotine or tobacco, such as cigarettes and e-cigarettes. If you need help quitting, ask your health care provider.  Sleep with the head of your bed raised. Use a wedge under the mattress or blocks under the bed frame to raise the head of the bed. What foods are not recommended? The items listed may not be a complete  list. Talk with your dietitian about what dietary choices are best for you. Grains Pastries or quick breads with added fat. French toast. Vegetables Deep fried vegetables. French fries. Any vegetables prepared with added fat. Any vegetables that cause symptoms. For some people this may include tomatoes and tomato products, chili peppers, onions and garlic, and horseradish. Fruits Any fruits prepared with added fat. Any fruits that cause symptoms. For some people this may include citrus fruits, such as oranges, grapefruit, pineapple, and lemons. Meats and other protein foods High-fat meats, such as fatty beef or pork, hot dogs, ribs, ham, sausage, salami and bacon. Fried meat or protein, including fried fish and fried chicken. Nuts and nut butters. Dairy Whole milk and chocolate milk. Sour cream. Cream. Ice cream. Cream cheese. Milk shakes. Beverages Coffee and tea, with or without caffeine. Carbonated beverages. Sodas. Energy drinks. Fruit juice made with acidic fruits (such as [place] or grapefruit). Tomato juice. Alcoholic drinks. Fats and oils Butter. Margarine. Shortening. Ghee. Sweets and desserts Chocolate and cocoa. Donuts. Seasoning and other foods Pepper. Peppermint and spearmint. Any condiments, herbs, or seasonings that cause symptoms. For some people, this may include curry, hot sauce, or vinegar-based salad dressings. Summary  When you have gastroesophageal reflux disease (GERD), food and lifestyle choices are very important to help ease the discomfort of GERD.  Eat frequent, small meals instead of three large meals each day. Eat your meals slowly, in a relaxed setting. Avoid bending over or lying down until 2-3 hours after eating.  Limit high-fat   foods such as fatty meat or fried foods. This information is not intended to replace advice given to you by your health care provider. Make sure you discuss any questions you have with your health care provider. Document Released:  10/02/2005 Document Revised: 10/03/2016 Document Reviewed: 10/03/2016 Elsevier Interactive Patient Education  2017 [employer]  Avoid eating within 2-3 hours of bedtime Take OTC Chlorpheniramine 4 mg once at night Consider elevate head of bed 4 to 6 inches Consider Nasacort at night Get back on Prevacid one daily. Let me know in 2-3 weeks if no better.

## 2017-06-22 NOTE — Progress Notes (Signed)
Subjective:     Patient ID: Monica Bailey, female   DOB: 05/04/1976, 41 y.o.   MRN: [id number]  HPI   Patient seen with persistent cough for the past several weeks. Duration over 6 weeks. She smoked only briefly back in her 4720s. She's not had any recent fevers or chills. No hemoptysis. No appetite or weight changes. She does have a history of reflux and took Aciphex in the past but has not been on this now for consistently for several months. She has frequent postnasal drip symptoms at night. No wheezing. No facial pain or headache. Her cough is dry and seems to be worse at night. She has noted that she frequently "clears her throat ".  She had coronary CT back in April and lungs were clear that time  Past Medical History:  Diagnosis Date  . Abdominal pain, left lower quadrant 06/09/2010  . 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 Allergy   . 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 GERD 03/01/2009  . Headache(784.0) 03/01/2009  . Hiatal hernia   . IBS (irritable bowel syndrome)    worse in college  . Viral conjunctivitis    h/o left eye   Past Surgical History:  Procedure Laterality Date  . ABDOMINAL HYSTERECTOMY  12/09  . 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 ESOPHAGOGASTRODUODENOSCOPY  2014   [place] - [place]  . LEEP  12/08   10/98 and then 12/08 CIN II/III involving glandsand with positive endocervical margin    reports that she quit smoking about 20 years ago. Her smoking use included Cigarettes. She has a 6.00 pack-year smoking history. She has never used smokeless tobacco. She reports that she drinks about 12.0 - 13.2 oz of alcohol per week . She reports that she does not use drugs. family history includes Alcohol abuse in her other; Arthritis in her other; Cancer in her other; Colon polyps in her mother; Diabetes in her maternal grandmother; Diverticulosis in her father and mother; Heart disease in her  maternal grandfather and other; Hypertension in her father, mother, and other; Osteoporosis in her maternal grandmother. Allergies  Allergen Reactions  . Penicillins      Review of Systems  Constitutional: Negative for appetite change, chills, fever and unexpected weight change.  HENT: Positive for postnasal drip. Negative for sinus pain, sore throat and voice change.   Respiratory: Positive for cough. Negative for shortness of breath and wheezing.   Cardiovascular: Negative for chest pain, palpitations and leg swelling.       Objective:   Physical Exam  Constitutional: She appears well-developed and well-nourished.  Neck: Neck supple.  Cardiovascular: Normal rate and regular rhythm.   Pulmonary/Chest: Effort normal and breath sounds normal. No respiratory distress. She has no wheezes. She has no rales.  Lymphadenopathy:    She has no cervical adenopathy.       Assessment:     Persistent cough. Differential is allergic postnasal drip versus GERD. She does not have any red flags such as appetite or weight changes    Plan:     -We recommend consider elevate head of bed 4-6 inches -Avoid eating within 2-3 hours of bedtime -Consider over-the-counter chlorpheniramine 4 mg daily at bedtime and start back Nasacort -Get back on daily Prevacid -Handout on GERD given -Touch base in a few weeks if cough not improving  Kristian CoveyBruce W Camyah Pultz MD [facility] at  [facility]

## 2017-07-04 DIAGNOSIS — D2262 Melanocytic nevi of left upper limb, including shoulder: Secondary | ICD-10-CM | POA: Diagnosis not present

## 2017-07-04 DIAGNOSIS — D2261 Melanocytic nevi of right upper limb, including shoulder: Secondary | ICD-10-CM | POA: Diagnosis not present

## 2017-07-04 DIAGNOSIS — D1801 Hemangioma of skin and subcutaneous tissue: Secondary | ICD-10-CM | POA: Diagnosis not present

## 2017-07-04 DIAGNOSIS — D224 Melanocytic nevi of scalp and neck: Secondary | ICD-10-CM | POA: Diagnosis not present

## 2017-07-05 ENCOUNTER — Encounter: Payer: Self-pay | Admitting: Family Medicine

## 2017-07-20 DIAGNOSIS — E28319 Asymptomatic premature menopause: Secondary | ICD-10-CM | POA: Insufficient documentation

## 2017-07-24 ENCOUNTER — Encounter: Payer: Self-pay | Admitting: Family Medicine

## 2017-07-24 DIAGNOSIS — R05 Cough: Secondary | ICD-10-CM

## 2017-07-24 DIAGNOSIS — R059 Cough, unspecified: Secondary | ICD-10-CM

## 2017-07-30 ENCOUNTER — Ambulatory Visit (INDEPENDENT_AMBULATORY_CARE_PROVIDER_SITE_OTHER): Payer: BLUE CROSS/BLUE SHIELD | Admitting: Obstetrics & Gynecology

## 2017-07-30 ENCOUNTER — Other Ambulatory Visit (HOSPITAL_COMMUNITY)
Admission: RE | Admit: 2017-07-30 | Discharge: 2017-07-30 | Disposition: A | Discharge status: Home / Self Care | Payer: BLUE CROSS/BLUE SHIELD | Source: Ambulatory Visit | Attending: Obstetrics & Gynecology | Admitting: Obstetrics & Gynecology

## 2017-07-30 ENCOUNTER — Encounter: Payer: Self-pay | Admitting: Obstetrics & Gynecology

## 2017-07-30 VITALS — BP 134/82 | HR 74 | Resp 12 | Ht 66.25 in | Wt 177.0 lb

## 2017-07-30 DIAGNOSIS — Z01419 Encounter for gynecological examination (general) (routine) without abnormal findings: Secondary | ICD-10-CM | POA: Diagnosis not present

## 2017-07-30 DIAGNOSIS — Z124 Encounter for screening for malignant neoplasm of cervix: Secondary | ICD-10-CM

## 2017-07-30 MED ORDER — ESTRADIOL 0.5 MG PO TABS: 0.5000 mg | ORAL_TABLET | Freq: Every day | ORAL | 4 refills | Status: DC

## 2017-07-30 MED ORDER — ESCITALOPRAM OXALATE 10 MG PO TABS: 10.0000 mg | ORAL_TABLET | Freq: Every day | ORAL | 4 refills | Status: DC

## 2017-07-30 NOTE — Progress Notes (Signed)
41 y.o. G0P0000 MarriedCaucasianF here for annual exam.  Doing well.  Stopped OCPs.  Having some hot flashes.  Denies vaginal dryness.  Feels sleep is ok.  Husband doing really well.  Felt all of her problems/complaints earlier in the year were due to stress from husband's heart disease.  This is much better.  Patient's last menstrual period was 10/17/2007 (approximate).          Sexually active: Yes.    The current method of family planning is status post hysterectomy.   Exercising: Yes.    walking, yoga Smoker:  Former smoker   Health Maintenance: Pap:  07/14/16 negative, HR HPV negative, 05/13/15 negative, 04/10/14 negative  History of abnormal Pap:  yes MMG:  12/12/16 BIRADS 1 negative  Colonoscopy:  08/09/16 Dr. Leone Payor- normal- repeat 10 years BMD:   never TDaP:  03/06/17 Pneumonia vaccine(s):  never Zostavax:   never Hep C testing: not indicated  Screening Labs: discuss with provider, Hb today: same, Urine today: not collected   reports that she quit smoking about 20 years ago. Her smoking use included Cigarettes. She has a 6.00 pack-year smoking history. She has never used smokeless tobacco. She reports that she drinks about 12.0 - 13.2 oz of alcohol per week . She reports that she does not use drugs.  Past Medical History:  Diagnosis Date  . 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 GERD 03/01/2009  . Headache(784.0) 03/01/2009  . Hiatal hernia   . IBS (irritable bowel syndrome)    worse in college  . Viral conjunctivitis    h/o left eye    Past Surgical History:  Procedure Laterality Date  . ABDOMINAL HYSTERECTOMY  12/09  . 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 ESOPHAGOGASTRODUODENOSCOPY  2014   [place] - [place] [facility]  . LEEP  12/08   10/98 and then 12/08 CIN II/III involving glandsand with positive endocervical margin    Current Outpatient Prescriptions   Medication Sig Dispense Refill  . diphenhydrAMINE (BENADRYL) 25 mg capsule Take 25 mg by mouth every 6 (six) hours as needed for allergies.     Marland Kitchen escitalopram (LEXAPRO) 10 MG tablet Take 1 tablet (10 mg total) by mouth daily. 90 tablet 4  . ibuprofen (ADVIL,MOTRIN) 200 MG tablet Take 200 mg by mouth every 6 (six) hours as needed for moderate pain.    Marland Kitchen MIRVASO 0.33 % GEL Apply A thin layer TO FACE EVERY DAY AS NEEDED.  0  . Naproxen Sodium 220 MG CAPS Take 220 mg by mouth every 12 (twelve) hours as needed (pain).     . Probiotic Product (ALIGN PO) Take 1 capsule by mouth daily.    . RABEprazole (ACIPHEX) 20 MG tablet TAKE 1 TABLET (20 MG TOTAL) BY MOUTH DAILY. GENERIC NECESSARY 90 tablet 1  . Wheat Dextrin (BENEFIBER PO) Take 1 packet by mouth daily as needed (supplement).    Marland Kitchen estradiol (ESTRACE) 0.5 MG tablet Take 1 tablet (0.5 mg total) by mouth daily. 90 tablet 4   No current facility-administered medications for this visit.     Family History  Problem Relation Age of Onset  . Arthritis Other   . Alcohol abuse Other   . Cancer Other        lung  . Heart disease Other   . Hypertension Other        maternal side  .  Diabetes Maternal Grandmother   . Osteoporosis Maternal Grandmother   . Hypertension Mother   . Colon polyps Mother   . Diverticulosis Mother   . Hypertension Father   . Diverticulosis Father   . Heart disease Maternal Grandfather     ROS:  Pertinent items are noted in HPI.  Otherwise, a comprehensive ROS was negative.  Exam:   BP 134/82 (BP Location: Right Arm, Patient Position: Sitting, Cuff Size: Normal)   Pulse 74   Resp 12   Ht 5' 6.25" (1.683 m)   Wt 177 lb (80.3 kg)   LMP 10/17/2007 (Approximate)   BMI 28.35 kg/m   Weight change: +12# Height: 5' 6.25" (168.3 cm)  Ht Readings from Last 3 Encounters:  07/30/17 5' 6.25" (1.683 m)  03/06/17  (1.702 m)  02/08/17  (1.676 m)    General appearance: alert, cooperative and appears stated  age Head: Normocephalic, without obvious abnormality, atraumatic Neck: no adenopathy, supple, symmetrical, trachea midline and thyroid normal to inspection and palpation Lungs: clear to auscultation bilaterally Breasts: normal appearance, no masses or tenderness Heart: regular rate and rhythm Abdomen: soft, non-tender; bowel sounds normal; no masses,  no organomegaly Extremities: extremities normal, atraumatic, no cyanosis or edema Skin: Skin color, texture, turgor normal. No rashes or lesions Lymph nodes: Cervical, supraclavicular, and axillary nodes normal. No abnormal inguinal nodes palpated Neurologic: Grossly normal   Pelvic: External genitalia:  no lesions, multiple vulvar sebaceous cysts notes              Urethra:  normal appearing urethra with no masses, tenderness or lesions              Bartholins and Skenes: normal                 Vagina: normal appearing vagina with normal color and discharge, no lesions              Cervix: absent              Pap taken: No. Bimanual Exam:  Uterus:  uterus absent              Adnexa: no mass, fullness, tenderness               Rectovaginal: Confirms               Anus:  normal sphincter tone, no lesions  Chaperone was present for exam.  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   pap smear obtained today Estradiol 0.5 mg daily.  30/12 RF.  Pt knows she can increase and advised to give update in about a month. Lexapro , pt is taking 1/2 tab daily but knows she can increase this to  daily if needed.  #30/12RF Return annually or prn

## 2017-08-02 ENCOUNTER — Encounter: Payer: Self-pay | Admitting: Obstetrics & Gynecology

## 2017-08-02 LAB — CYTOLOGY - PAP: DIAGNOSIS: NEGATIVE

## 2017-08-07 ENCOUNTER — Telehealth: Payer: Self-pay | Admitting: Family Medicine

## 2017-08-07 NOTE — Telephone Encounter (Signed)
error 

## 2017-08-15 ENCOUNTER — Other Ambulatory Visit: Payer: Self-pay | Admitting: *Deleted

## 2017-08-15 MED ORDER — ESTRADIOL 0.5 MG PO TABS: 0.5000 mg | ORAL_TABLET | Freq: Every day | ORAL | 4 refills | Status: DC

## 2017-08-15 MED ORDER — ESCITALOPRAM OXALATE 10 MG PO TABS: 10.0000 mg | ORAL_TABLET | Freq: Every day | ORAL | 4 refills | Status: DC

## 2017-08-15 NOTE — Telephone Encounter (Signed)
Medication refill request: lexapro and estrace  Last AEX:  07/30/17 SM Next AEX: 10/15/18  Last MMG (if hormonal medication request): 12/12/16 BIRADS1:neg  Refill authorized: both 07/30/17 #90/4R. To [facility]  [facility]. [facility]  [facility] by Dr[facility] Hyacinth MeekerMiller.

## 2017-08-21 ENCOUNTER — Encounter: Payer: Self-pay | Admitting: Pulmonary Disease

## 2017-08-21 ENCOUNTER — Ambulatory Visit: Payer: BLUE CROSS/BLUE SHIELD | Admitting: Pulmonary Disease

## 2017-08-21 ENCOUNTER — Ambulatory Visit (INDEPENDENT_AMBULATORY_CARE_PROVIDER_SITE_OTHER)
Admission: RE | Admit: 2017-08-21 | Discharge: 2017-08-21 | Disposition: A | Discharge status: Home / Self Care | Payer: BLUE CROSS/BLUE SHIELD | Source: Ambulatory Visit | Attending: Pulmonary Disease | Admitting: Pulmonary Disease

## 2017-08-21 VITALS — BP 124/78 | HR 77 | Ht 66.0 in | Wt 181.4 lb

## 2017-08-21 DIAGNOSIS — J301 Allergic rhinitis due to pollen: Secondary | ICD-10-CM

## 2017-08-21 DIAGNOSIS — R059 Cough, unspecified: Secondary | ICD-10-CM

## 2017-08-21 DIAGNOSIS — R05 Cough: Secondary | ICD-10-CM

## 2017-08-21 DIAGNOSIS — K219 Gastro-esophageal reflux disease without esophagitis: Secondary | ICD-10-CM | POA: Diagnosis not present

## 2017-08-21 LAB — NITRIC OXIDE: NITRIC OXIDE: 14

## 2017-08-21 MED ORDER — BENZONATATE 200 MG PO CAPS: 200.0000 mg | ORAL_CAPSULE | Freq: Three times a day (TID) | ORAL | 1 refills | Status: DC | PRN

## 2017-08-21 NOTE — Progress Notes (Signed)
Subjective:    Patient ID: Monica Bailey, female    DOB: 04/28/1976, 41 y.o.   MRN: [id number]  Synopsis: Referred in 2018 for cough  HPI Chief Complaint  Patient presents with  . Advice Only    Referred by Dr. Caryl NeverBurchette due to having a persistent cough; thinking it might be allergy related but wanted to get lungs checked to see if it might be something else.  States that she has occ. SOB and occ. CP.    Cough: > for two months > started after she had some body aches, felt under the weather > no antibiotics > worse with sleeping > every night > wakes her up > itching, scratching sensation in her throat > feels dry in her throat > has to clear her throat a lot > water helps > sometimes worse in the morning > benadryl helps > has had seasonal allergies in the past > occasional dyspnea > she feels some anxiety from time to time with anxiety > happens at work more than house, all day in the work day > talking, laughing doesn't matter > meals don't help  Heartburn: > she takes aciphex which helps with this   Allergies: > were worse at the end of the summer > lots of sinus congestion, post nasal drip  Never had lung problems, no childhood respiratory illnesses.  She smoked in the past around college for about 6 years, perhaps 1-2 packs per day.    She works in an Recruitment consultantoffice environment.  She works at [employer].  The building is being renovated.  There is a lot of constructoin in the building.   Past Medical History:  Diagnosis Date  . 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 GERD 03/01/2009  . Headache(784.0) 03/01/2009  . Hiatal hernia   . IBS (irritable bowel syndrome)    worse in college  . Viral conjunctivitis    h/o left eye     Family History  Problem Relation Age of Onset  . Arthritis Other   . Alcohol abuse Other   . Cancer Other        lung  .  Diabetes Maternal Grandmother   . Osteoporosis Maternal Grandmother   . Hypertension Mother   . Colon polyps Mother   . Diverticulosis Mother   . Hypertension Father   . Diverticulosis Father   . Heart disease Maternal Grandfather      Social History   Socioeconomic History  . Marital status: Married    Spouse name: Not on file  . Number of children: Not on file  . Years of education: Not on file  . Highest education level: Not on file  Social Needs  . Financial resource strain: Not on file  . Food insecurity - worry: Not on file  . Food insecurity - inability: Not on file  . Transportation needs - medical: Not on file  . Transportation needs - non-medical: Not on file  Occupational History  . Occupation: Emergency planning/management officerroject Manager  Tobacco Use  . Smoking status: Former Smoker    Packs/day: 1.00    Years: 6.00    Pack years: 6.00    Types: Cigarettes    Last attempt to quit: 02/12/1997    Years since quitting: 20.5  . Smokeless tobacco: Never Used  Substance and Sexual Activity  . Alcohol use: Yes    Alcohol/week: 12.0 - 13.2  oz    Types: 12 - 14 Glasses of wine, 8 Standard drinks or equivalent per week    Comment: 12-14  . Drug use: No  . Sexual activity: Yes    Partners: Male    Birth control/protection: Surgical    Comment: TAH  Other Topics Concern  . Not on file  Social History Narrative   Married, no children   Employed as a Emergency planning/management officer   1-2 alcoholic beverages daily in one caffeinated beverage daily   03/02/2015, last update     Allergies  Allergen Reactions  . Penicillins      Outpatient Medications Prior to Visit  Medication Sig Dispense Refill  . diphenhydrAMINE (BENADRYL) 25 mg capsule Take 25 mg by mouth every 6 (six) hours as needed for allergies.     Marland Kitchen escitalopram (LEXAPRO) 10 MG tablet Take 1 tablet (10 mg total) by mouth daily. 90 tablet 4  . estradiol (ESTRACE) 0.5 MG tablet Take 1 tablet (0.5 mg total) by mouth daily. 90 tablet 4  . ibuprofen  (ADVIL,MOTRIN) 200 MG tablet Take 200 mg by mouth every 6 (six) hours as needed for moderate pain.    Marland Kitchen MIRVASO 0.33 % GEL Apply A thin layer TO FACE EVERY DAY AS NEEDED.  0  . Naproxen Sodium 220 MG CAPS Take 220 mg by mouth every 12 (twelve) hours as needed (pain).     . RABEprazole (ACIPHEX) 20 MG tablet TAKE 1 TABLET (20 MG TOTAL) BY MOUTH DAILY. GENERIC NECESSARY 90 tablet 1  . Wheat Dextrin (BENEFIBER PO) Take 1 packet by mouth daily as needed (supplement).    . Probiotic Product (ALIGN PO) Take 1 capsule by mouth daily.     No facility-administered medications prior to visit.        Review of Systems  Constitutional: Negative for appetite change, chills, diaphoresis, fatigue and fever.  HENT: Negative for congestion, hearing loss, nosebleeds, postnasal drip, rhinorrhea, sinus pressure, sore throat and trouble swallowing.   Eyes: Negative for discharge, redness and visual disturbance.  Respiratory: Positive for cough and chest tightness. Negative for choking, shortness of breath and wheezing.   Cardiovascular: Negative for chest pain and leg swelling.  Gastrointestinal: Negative for abdominal pain, blood in stool, constipation, diarrhea and nausea.  Genitourinary: Negative for dysuria, frequency and hematuria.  Musculoskeletal: Negative for arthralgias, joint swelling, myalgias and neck stiffness.  Skin: Negative for color change, pallor and rash.  Neurological: Negative for dizziness, seizures, facial asymmetry, speech difficulty, light-headedness, numbness and headaches.  Hematological: Negative for adenopathy. Does not bruise/bleed easily.       Objective:   Physical Exam  Vitals:   08/21/17 1558  BP: 124/78  Pulse: 77  SpO2: 98%  Weight: 181 lb 6.4 oz (82.3 kg)  Height: 5\' 6"  (1.676 m)    Gen: well appearing, no acute distress HENT: NCAT, OP clear, neck supple without masses Eyes: PERRL, EOMi Lymph: no cervical lymphadenopathy PULM: CTA B CV: RRR, no mgr, no  JVD GI: BS+, soft, nontender, no hsm Derm: no rash or skin breakdown MSK: normal bulk and tone Neuro: A&Ox4, CN II-XII intact, strength 5/5 in all 4 extremities Psyche: normal mood and affect   CBC    Component Value Date/Time   WBC 5.3 03/06/2017 1002   RBC 3.77 (L) 03/06/2017 1002   HGB 12.7 03/06/2017 1002   HCT 37.2 03/06/2017 1002   PLT 230.0 03/06/2017 1002   MCV 98.8 03/06/2017 1002   MCH 33.1 09/04/2016 0945  MCHC 34.1 03/06/2017 1002   RDW 14.8 03/06/2017 1002   LYMPHSABS 1.0 03/06/2017 1002   MONOABS 0.4 03/06/2017 1002   EOSABS 0.0 03/06/2017 1002   BASOSABS 0.0 03/06/2017 1002    Records from a visit with her PCP in 06/2017 reviewed where it was felt she had GERD causing cough  Chest imaging: 01/2017 Calcium score chest: partial images of lungs reviewed showing normal pulmonary parenchyma  Exhaled NO 08/21/17 14 ppm     Assessment & Plan:    Cough - Plan: Nitric oxide, DG Chest 2 View  Gastroesophageal reflux disease without esophagitis  Allergic rhinitis due to pollen, unspecified seasonality  Discussion: Monica Bailey presents with cough which has been persistent for the last 2 months.  She describes poorly controlled allergic rhinitis, some persistent acid reflux.  However, neither of these problems seem to be severe.  So I think they are contributing to her upper airway cough syndrome, but the cough is most likely due to heightened laryngeal sensitivity or irritable larynx syndrome.  Some people call this cyclical cough.  Based on her normal physical exam I doubt there is a lung pathology here, she had an image of her chest performed earlier this year which was normal, though given the recent persistence of this cough we will arrange for a chest x-ray to make sure there is nothing else going on.  Her exhaled nitric oxide test today was normal so there is little evidence of eosinophilic inflammation in the airways from something like asthma or eosinophilic  bronchitis.  Assuming the chest x-ray is normal than the best plan moving forward is to get the acid reflux under better control, get the allergic rhinitis under better control, and encouraged her to rest her voice is much as possible to allow her vocal cords to heal.  Plan: Acid reflux: Take the AcipHex in the morning Take Pepcid in the evening Do not eat within 3 hours of bedtime Prop the head of your bed up Follow the acid reflux lifestyle modification sheet we gave you  Allergic rhinitis: Use Neil Med rinses with distilled water at least twice per day using the instructions on the package. 1/2 hour after using the [facility] rinse, use Nasacort two puffs in each nostril once per day.  Remember that the Nasacort can take 1-2 weeks to work after regular use. Use generic zyrtec (cetirizine) every day.  If this doesn't help, then stop taking it and use chlorpheniramine-phenylephrine combination tablets.  Cough: We will check a chest x-ray and call you with the results We will check a test called exhaled nitric oxide testing to make sure there is no evidence of asthmatic type inflammation in your lungs Take Tessalon every 8 hours as needed for the cough You need to try to suppress your cough to allow your larynx (voice box) to heal.  For three days don't talk, laugh, sing, or clear your throat. Do everything you can to suppress the cough during this time. Use hard candies (sugarless Jolly Ranchers) or non-mint or non-menthol containing cough drops during this time to soothe your throat.  Use a cough suppressant (Delsym or what I have prescribed you) around the clock during this time.  After three days, gradually increase the use of your voice and back off on the cough suppressants.  We will see you back in 3-4 weeks if the cough is not better    Current Outpatient Medications:  .  diphenhydrAMINE (BENADRYL) 25 mg capsule, Take 25 mg by mouth  every 6 (six) hours as needed for allergies. ,  Disp: , Rfl:  .  escitalopram (LEXAPRO) 10 MG tablet, Take 1 tablet (10 mg total) by mouth daily., Disp: 90 tablet, Rfl: 4 .  estradiol (ESTRACE) 0.5 MG tablet, Take 1 tablet (0.5 mg total) by mouth daily., Disp: 90 tablet, Rfl: 4 .  ibuprofen (ADVIL,MOTRIN) 200 MG tablet, Take 200 mg by mouth every 6 (six) hours as needed for moderate pain., Disp: , Rfl:  .  MIRVASO 0.33 % GEL, Apply A thin layer TO FACE EVERY DAY AS NEEDED., Disp: , Rfl: 0 .  Naproxen Sodium 220 MG CAPS, Take 220 mg by mouth every 12 (twelve) hours as needed (pain). , Disp: , Rfl:  .  RABEprazole (ACIPHEX) 20 MG tablet, TAKE 1 TABLET (20 MG TOTAL) BY MOUTH DAILY. GENERIC NECESSARY, Disp: 90 tablet, Rfl: 1 .  Wheat Dextrin (BENEFIBER PO), Take 1 packet by mouth daily as needed (supplement)., Disp: , Rfl:  .  benzonatate (TESSALON) 200 MG capsule, Take 1 capsule (200 mg total) 3 (three) times daily as needed by mouth for cough., Disp: 45 capsule, Rfl: 1

## 2017-08-21 NOTE — Progress Notes (Signed)
   Subjective:    Patient ID: Genella MechBrenna L Eckert, female    DOB: 12/26/1975, 41 y.o.   MRN: [id number]  HPI    Review of Systems  Constitutional: Positive for unexpected weight change. Negative for fever.  HENT: Positive for ear pain, postnasal drip, sinus pressure and sneezing. Negative for congestion, dental problem, nosebleeds, rhinorrhea, sore throat and trouble swallowing.   Eyes: Negative for redness and itching.  Respiratory: Positive for cough, chest tightness and shortness of breath. Negative for wheezing.   Cardiovascular: Negative for palpitations and leg swelling.  Gastrointestinal: Negative for nausea and vomiting.  Genitourinary: Negative for dysuria.  Musculoskeletal: Negative for joint swelling.  Skin: Negative for rash.  Allergic/Immunologic: Negative.  Negative for environmental allergies, food allergies and immunocompromised state.  Neurological: Negative for headaches.  Hematological: Bruises/bleeds easily.  Psychiatric/Behavioral: Negative for dysphoric mood. The patient is nervous/anxious.        Objective:   Physical Exam        Assessment & Plan:

## 2017-08-21 NOTE — Patient Instructions (Addendum)
Acid reflux: Take the AcipHex in the morning Take Pepcid in the evening Do not eat within 3 hours of bedtime Prop the head of your bed up Follow the acid reflux lifestyle modification sheet we gave you  Allergic rhinitis: Use Neil Med rinses with distilled water at least twice per day using the instructions on the package. 1/2 hour after using the [facility] rinse, use Nasacort two puffs in each nostril once per day.  Remember that the Nasacort can take 1-2 weeks to work after regular use. Use generic zyrtec (cetirizine) every day.  If this doesn't help, then stop taking it and use chlorpheniramine-phenylephrine combination tablets.  Cough: We will check a chest x-ray and call you with the results We will check a test called exhaled nitric oxide testing to make sure there is no evidence of asthmatic type inflammation in your lungs Take Tessalon every 8 hours as needed for the cough You need to try to suppress your cough to allow your larynx (voice box) to heal.  For three days don't talk, laugh, sing, or clear your throat. Do everything you can to suppress the cough during this time. Use hard candies (sugarless Jolly Ranchers) or non-mint or non-menthol containing cough drops during this time to soothe your throat.  Use a cough suppressant (Delsym or what I have prescribed you) around the clock during this time.  After three days, gradually increase the use of your voice and back off on the cough suppressants.  We will see you back in 3-4 weeks if the cough is not better

## 2017-09-13 ENCOUNTER — Ambulatory Visit: Payer: Self-pay | Admitting: Pulmonary Disease

## 2017-10-03 ENCOUNTER — Encounter: Payer: Self-pay | Admitting: Obstetrics & Gynecology

## 2017-10-03 ENCOUNTER — Telehealth: Payer: Self-pay | Admitting: Obstetrics & Gynecology

## 2017-10-03 NOTE — Telephone Encounter (Signed)
Spoke with patient. Advised patient will need to stay on current dose of Estradiol 0.5 mg daily. Advised with missed doses can cause increased symptoms. Will need to take her Estradiol at the same time daily. Advised if symptoms do not resolve with consistent Estradiol usage will need to contact the office. Patient is agreeable.  Routing to Dr.Miller for final review.

## 2017-10-03 NOTE — Telephone Encounter (Signed)
-----   Message from Mychart, Generic sent at 10/03/2017 1:44 PM EST -----    Hello Dr Hyacinth MeekerMiller,    So, I have been taking the Estradiol .5 mg everyday since you prescribed it. I mostly have good days but I did miss the dose a couple of days this week and I notice the feeling of uneasiness/anxiety came back pretty quick. I also had a pretty long hot flash/panic attack this morning. Just wanted to get your advice... should I just continue with the .5mg  dosage and see how I do once I am back on it for a week or so, or would you suggest upping the dosage? I definitely don't want to be on more than necessary just wanted to get your thoughts.    Thanks so much and happy holidays!  Monica Bailey

## 2017-10-11 DIAGNOSIS — R12 Heartburn: Secondary | ICD-10-CM | POA: Diagnosis not present

## 2017-10-11 DIAGNOSIS — Z793 Long term (current) use of hormonal contraceptives: Secondary | ICD-10-CM | POA: Diagnosis not present

## 2017-10-11 DIAGNOSIS — Z880 Allergy status to penicillin: Secondary | ICD-10-CM | POA: Diagnosis not present

## 2017-10-11 DIAGNOSIS — M542 Cervicalgia: Secondary | ICD-10-CM | POA: Diagnosis not present

## 2017-10-11 DIAGNOSIS — R001 Bradycardia, unspecified: Secondary | ICD-10-CM | POA: Diagnosis not present

## 2017-10-11 DIAGNOSIS — R0789 Other chest pain: Secondary | ICD-10-CM | POA: Diagnosis not present

## 2017-10-11 DIAGNOSIS — R079 Chest pain, unspecified: Secondary | ICD-10-CM | POA: Diagnosis not present

## 2017-10-11 DIAGNOSIS — Z79899 Other long term (current) drug therapy: Secondary | ICD-10-CM | POA: Diagnosis not present

## 2017-10-11 DIAGNOSIS — R0602 Shortness of breath: Secondary | ICD-10-CM | POA: Diagnosis not present

## 2017-10-11 DIAGNOSIS — M549 Dorsalgia, unspecified: Secondary | ICD-10-CM | POA: Diagnosis not present

## 2017-10-11 DIAGNOSIS — R1013 Epigastric pain: Secondary | ICD-10-CM | POA: Diagnosis not present

## 2017-10-11 DIAGNOSIS — R509 Fever, unspecified: Secondary | ICD-10-CM | POA: Diagnosis not present

## 2017-10-11 DIAGNOSIS — Z87891 Personal history of nicotine dependence: Secondary | ICD-10-CM | POA: Diagnosis not present

## 2017-10-11 DIAGNOSIS — K219 Gastro-esophageal reflux disease without esophagitis: Secondary | ICD-10-CM | POA: Diagnosis not present

## 2017-10-24 ENCOUNTER — Ambulatory Visit: Payer: BLUE CROSS/BLUE SHIELD | Admitting: Family Medicine

## 2017-10-24 ENCOUNTER — Encounter: Payer: Self-pay | Admitting: Family Medicine

## 2017-10-24 VITALS — BP 124/80 | HR 73 | Temp 98.3°F | Wt 181.9 lb

## 2017-10-24 DIAGNOSIS — M542 Cervicalgia: Secondary | ICD-10-CM

## 2017-10-24 DIAGNOSIS — R05 Cough: Secondary | ICD-10-CM | POA: Diagnosis not present

## 2017-10-24 DIAGNOSIS — K219 Gastro-esophageal reflux disease without esophagitis: Secondary | ICD-10-CM | POA: Diagnosis not present

## 2017-10-24 DIAGNOSIS — R059 Cough, unspecified: Secondary | ICD-10-CM

## 2017-10-24 DIAGNOSIS — R238 Other skin changes: Secondary | ICD-10-CM

## 2017-10-24 NOTE — Progress Notes (Signed)
Subjective:     Patient ID: Monica Bailey, female   DOB: 05/21/1976, 42 y.o.   MRN: [id number]  HPI   Patient seen for the following issues:  Onset of cold-like symptoms couple weeks ago after being around some nieces and nephews. Now has some cough. Improved with Benadryl. Has some postnasal drip. No fever. No dyspnea.  Left-sided neck pain. This is more down around the left anterior neck and lateral neck. Denies any posterior neck pain and no radiculitis type symptoms. No clear precipitating factors. Denies any upper extending numbness or weakness. She also noted small pimple-like lesion left axillary region and this apparently drained some early this morning. Minimal pain associated with that. No fever. No cellulitis changes.  She had recent episode of substernal pain and severe heartburn. Was placed back on proton pump inhibitor by ER physician. Workup there was unremarkable. She's been referred to GI. Denies any active GERD symptoms currently. No difficulty swallowing. No exertional chest pain.  Past Medical History:  Diagnosis Date  . 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 GERD 03/01/2009  . Headache(784.0) 03/01/2009  . Hiatal hernia   . IBS (irritable bowel syndrome)    worse in college  . Viral conjunctivitis    h/o left eye   Past Surgical History:  Procedure Laterality Date  . ABDOMINAL HYSTERECTOMY  12/09  . 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 ESOPHAGOGASTRODUODENOSCOPY  2014   NL - [place]  . LEEP  12/08   10/98 and then 12/08 CIN II/III involving glandsand with positive endocervical margin    reports that she quit smoking about 20 years ago. Her smoking use included cigarettes. She has a 6.00 pack-year smoking history. she has never used smokeless tobacco. She reports that she drinks about 12.0 - 13.2 oz of alcohol per week. She reports that she does  not use drugs. family history includes Alcohol abuse in her other; Arthritis in her other; Cancer in her other; Colon polyps in her mother; Diabetes in her maternal grandmother; Diverticulosis in her father and mother; Heart disease in her maternal grandfather; Hypertension in her father and mother; Osteoporosis in her maternal grandmother. Allergies  Allergen Reactions  . Penicillins      Review of Systems  Constitutional: Negative for chills and fever.  Respiratory: Negative for shortness of breath.   Cardiovascular: Negative for chest pain, palpitations and leg swelling.  Musculoskeletal: Positive for neck pain.  Hematological: Negative for adenopathy. Does not bruise/bleed easily.       Objective:   Physical Exam  Constitutional: She appears well-developed and well-nourished.  Neck: Neck supple. No thyromegaly present.  Cardiovascular: Normal rate and regular rhythm. Exam reveals no gallop.  No murmur heard. Pulmonary/Chest: Effort normal and breath sounds normal. No respiratory distress. She has no wheezes. She has no rales.  Musculoskeletal: She exhibits no edema.  Full range of motion cervical spine  Lymphadenopathy:    She has no cervical adenopathy.  Skin:  Small approximately 2 mm erythematous papule left axillary region. No fluctuance. No cellulitis changes. No significant axillary adenopathy       Assessment:     #1 cough probably secondary to acute viral process  #2 small papule left axillary region. Per patient, spontaneously drained earlier today. No evidence for deep abscess or cellulitis  #3 nonspecific left neck pain. No adenopathy.  Non-focal exam  #  4 recent GERD stable on PPI.    Plan:     -Reassurance regarding cough and over-the-counter medications as needed -Keep left axillary region clean with soap and water and follow-up for any redness or swelling or other concerns -Discuss GERD prevention. She has pending referral to GI for further evaluation  but does not have any red flags such as appetite change, weight change, or dysphagia.  Kristian Covey MD [facility] at [facility]

## 2017-10-24 NOTE — Patient Instructions (Signed)
Cough, Adult  Coughing is a reflex that clears your throat and your airways. Coughing helps to heal and protect your lungs. It is normal to cough occasionally, but a cough that happens with other symptoms or lasts a long time may be a sign of a condition that needs treatment. A cough may last only 2-3 weeks (acute), or it may last longer than 8 weeks (chronic).  What are the causes?  Coughing is commonly caused by:   Breathing in substances that irritate your lungs.   A viral or bacterial respiratory infection.   Allergies.   Asthma.   Postnasal drip.   Smoking.   Acid backing up from the stomach into the esophagus (gastroesophageal reflux).   Certain medicines.   Chronic lung problems, including COPD (or rarely, lung cancer).   Other medical conditions such as heart failure.    Follow these instructions at home:  Pay attention to any changes in your symptoms. Take these actions to help with your discomfort:   Take medicines only as told by your health care provider.  ? If you were prescribed an antibiotic medicine, take it as told by your health care provider. Do not stop taking the antibiotic even if you start to feel better.  ? Talk with your health care provider before you take a cough suppressant medicine.   Drink enough fluid to keep your urine clear or pale yellow.   If the air is dry, use a cold steam vaporizer or humidifier in your bedroom or your home to help loosen secretions.   Avoid anything that causes you to cough at work or at home.   If your cough is worse at night, try sleeping in a semi-upright position.   Avoid cigarette smoke. If you smoke, quit smoking. If you need help quitting, ask your health care provider.   Avoid caffeine.   Avoid alcohol.   Rest as needed.    Contact a health care provider if:   You have new symptoms.   You cough up pus.   Your cough does not get better after 2-3 weeks, or your cough gets worse.   You cannot control your cough with suppressant  medicines and you are losing sleep.   You develop pain that is getting worse or pain that is not controlled with pain medicines.   You have a fever.   You have unexplained weight loss.   You have night sweats.  Get help right away if:   You cough up blood.   You have difficulty breathing.   Your heartbeat is very fast.  This information is not intended to replace advice given to you by your health care provider. Make sure you discuss any questions you have with your health care provider.  Document Released: 03/31/2011 Document Revised: 03/09/2016 Document Reviewed: 12/09/2014  Elsevier Interactive Patient Education  2018 [employer] Inc[employer]

## 2017-10-30 ENCOUNTER — Ambulatory Visit: Payer: BLUE CROSS/BLUE SHIELD | Admitting: Obstetrics & Gynecology

## 2017-11-08 ENCOUNTER — Encounter: Payer: Self-pay | Admitting: Obstetrics & Gynecology

## 2017-11-08 DIAGNOSIS — K219 Gastro-esophageal reflux disease without esophagitis: Secondary | ICD-10-CM | POA: Diagnosis not present

## 2017-11-08 DIAGNOSIS — R0789 Other chest pain: Secondary | ICD-10-CM | POA: Diagnosis not present

## 2017-11-08 DIAGNOSIS — R945 Abnormal results of liver function studies: Secondary | ICD-10-CM | POA: Diagnosis not present

## 2017-11-15 DIAGNOSIS — R945 Abnormal results of liver function studies: Secondary | ICD-10-CM | POA: Diagnosis not present

## 2017-11-19 ENCOUNTER — Ambulatory Visit: Payer: Self-pay | Admitting: Family Medicine

## 2017-11-19 ENCOUNTER — Encounter: Payer: Self-pay | Admitting: Family Medicine

## 2017-11-19 MED ORDER — BENZONATATE 100 MG PO CAPS: 100.0000 mg | ORAL_CAPSULE | Freq: Three times a day (TID) | ORAL | 0 refills | Status: DC | PRN

## 2017-11-21 DIAGNOSIS — R1900 Intra-abdominal and pelvic swelling, mass and lump, unspecified site: Secondary | ICD-10-CM | POA: Diagnosis not present

## 2017-11-21 DIAGNOSIS — R7989 Other specified abnormal findings of blood chemistry: Secondary | ICD-10-CM | POA: Diagnosis not present

## 2017-11-21 DIAGNOSIS — K7689 Other specified diseases of liver: Secondary | ICD-10-CM | POA: Diagnosis not present

## 2017-11-21 DIAGNOSIS — K760 Fatty (change of) liver, not elsewhere classified: Secondary | ICD-10-CM | POA: Diagnosis not present

## 2017-11-21 DIAGNOSIS — R945 Abnormal results of liver function studies: Secondary | ICD-10-CM | POA: Diagnosis not present

## 2017-11-23 ENCOUNTER — Telehealth: Payer: Self-pay | Admitting: Family Medicine

## 2017-11-23 NOTE — Telephone Encounter (Signed)
Copied from CRM [id number]. Topic: Quick Communication - See Telephone Encounter >> Nov 23, 2017 11:14 AM Eston Mouldavis, Mahlani Berninger B wrote: CRM for notification. See Telephone encounter for:  Pt had u/s of liver this week and they found an abnormality on her kidney, they are sending the report over to Dr Caryl NeverBurchette.  The pt wants to know what she needs to do next, does he want her to make an apt with him. 11/23/17.

## 2017-11-23 NOTE — Telephone Encounter (Signed)
Can't comment until I see the report.

## 2017-11-27 NOTE — Telephone Encounter (Signed)
Offer appt if she would like.  I did review ultrasound report. 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  I would recommend referral to urologist to get their input regarding whether any further testing should be done at this point versus close monitoring

## 2017-11-27 NOTE — Telephone Encounter (Signed)
Left message on machine for patient to return our call 

## 2017-11-27 NOTE — Telephone Encounter (Signed)
Placed on Dr Caryl NeverBurchette desk

## 2017-12-04 NOTE — Telephone Encounter (Signed)
Spoke with patient and she has an appointment with urology 12/14/17.

## 2017-12-13 DIAGNOSIS — Z1231 Encounter for screening mammogram for malignant neoplasm of breast: Secondary | ICD-10-CM | POA: Diagnosis not present

## 2017-12-13 DIAGNOSIS — N6011 Diffuse cystic mastopathy of right breast: Secondary | ICD-10-CM | POA: Diagnosis not present

## 2017-12-14 DIAGNOSIS — D3001 Benign neoplasm of right kidney: Secondary | ICD-10-CM | POA: Diagnosis not present

## 2017-12-18 DIAGNOSIS — N6011 Diffuse cystic mastopathy of right breast: Secondary | ICD-10-CM | POA: Diagnosis not present

## 2017-12-18 DIAGNOSIS — Z1231 Encounter for screening mammogram for malignant neoplasm of breast: Secondary | ICD-10-CM | POA: Diagnosis not present

## 2017-12-18 DIAGNOSIS — R922 Inconclusive mammogram: Secondary | ICD-10-CM | POA: Diagnosis not present

## 2017-12-19 ENCOUNTER — Other Ambulatory Visit: Payer: Self-pay | Admitting: Radiology

## 2017-12-19 DIAGNOSIS — N6011 Diffuse cystic mastopathy of right breast: Secondary | ICD-10-CM | POA: Diagnosis not present

## 2018-01-01 DIAGNOSIS — D3001 Benign neoplasm of right kidney: Secondary | ICD-10-CM | POA: Diagnosis not present

## 2018-02-03 ENCOUNTER — Encounter: Payer: Self-pay | Admitting: Obstetrics & Gynecology

## 2018-02-04 ENCOUNTER — Telehealth: Payer: Self-pay | Admitting: Obstetrics & Gynecology

## 2018-02-04 NOTE — Telephone Encounter (Signed)
Patient sent the following correspondence through MyChart. Routing to triage to assist patient with request.  ----- Message from Mychart, Generic sent at 02/03/2018 12:06 PM EDT -----    Hello Dr Hyacinth MeekerMiller,    I have been taking 1/2 an Estradiol per day since January as recommended. It hasnt helped with weight loss and also, I noticed I have some hyperpigmentation around my lips, which seems to be another side effect. Should I try the patch? And my memory escapes me, why cant I just keep taking the birth control from pills :) ?Thanks! Monica Bailey

## 2018-02-04 NOTE — Telephone Encounter (Signed)
I'm reviewing this note for Dr Hyacinth MeekerMiller. The patient is on such a low dose of ERT, I would consider just stopping it. Hormones can contribute to the skin hyperpigmentation. I'm unable to find any literature that states the patch is better than oral estrogen for that. I would also recommend f/u with a Dermatologist for possible treatment.

## 2018-02-04 NOTE — Telephone Encounter (Signed)
Spoke with patient. Reduced to 1/2 tab, 0.25 mg estradiol po 10/2017. Patient reports no weight loss since reducing dose. Reports hyperpigmentation around lips since starting estradiol, is concerned this may be r/t hormones.   Denies any other GYN symptoms. Vasomotor symptoms controlled.   Patient states in previous MyChart message discussed with Dr. Hyacinth MeekerMiller possibility switching to patch, is this still an option? Will patch still have same effect of discoloration?   Advised Dr. Hyacinth MeekerMiller is out of the office, will review with covering provider and return call with recommendations, patient agreeable.   Dr. Oscar LaJertson -please review and advise?  Cc: Dr. Hyacinth MeekerMiller

## 2018-02-05 NOTE — Telephone Encounter (Signed)
Left message to call Donelda Mailhot at [phone].  

## 2018-02-05 NOTE — Telephone Encounter (Signed)
Spoke with patient, advised as seen below per Dr. Oscar LaJertson. Patient request OV with Dr. Hyacinth MeekerMiller for further discussion. OV scheduled for 02/20/18 at 11:15am with Dr. Hyacinth MeekerMiller.   Routing to provider for final review. Patient is agreeable to disposition. Will close encounter.

## 2018-02-05 NOTE — Telephone Encounter (Signed)
Patient called to return call from Jill. °

## 2018-02-06 DIAGNOSIS — K219 Gastro-esophageal reflux disease without esophagitis: Secondary | ICD-10-CM | POA: Diagnosis not present

## 2018-02-06 DIAGNOSIS — R079 Chest pain, unspecified: Secondary | ICD-10-CM | POA: Diagnosis not present

## 2018-02-06 DIAGNOSIS — R945 Abnormal results of liver function studies: Secondary | ICD-10-CM | POA: Diagnosis not present

## 2018-02-20 ENCOUNTER — Ambulatory Visit: Payer: BLUE CROSS/BLUE SHIELD | Admitting: Obstetrics & Gynecology

## 2018-02-20 ENCOUNTER — Encounter: Payer: Self-pay | Admitting: Obstetrics & Gynecology

## 2018-02-20 VITALS — BP 112/80 | HR 64 | Resp 16 | Ht 66.0 in | Wt 182.8 lb

## 2018-02-20 DIAGNOSIS — Z79890 Hormone replacement therapy (postmenopausal): Secondary | ICD-10-CM | POA: Diagnosis not present

## 2018-02-20 MED ORDER — ESTRADIOL 0.025 MG/24HR TD PTWK: 0.0250 mg | MEDICATED_PATCH | TRANSDERMAL | 12 refills | Status: DC

## 2018-02-20 NOTE — Progress Notes (Signed)
GYNECOLOGY  VISIT  CC:   Discuss HRT  HPI: 42 y.o. G41P0000 Married Caucasian female here for HRT consult.  Reports having recent issues with elevated liver enzymes that she thinks is related to increased wine intake.  Had difficult year after husband had bypass surgery.  He is much better and she is feeling much better as well but realizes she was coping some with wine.  She's stopped this and liver enzymes have normalized.  Frustrated with weight.  Exercising every day--walking two miles a day.  Feels that she can decrease the Lexapro at this point.  Reviewed with pt transdermal vs oral dosing including possible hepatic benefits.  She would like to try transdermal.  Going to [place] on Saturday so will wait until she returns to make any changes.  GYNECOLOGIC HISTORY: Patient's last menstrual period was 10/17/2007 (approximate). Contraception: hysterectomy  Menopausal hormone therapy: estradiol 0.25mg  daily  Patient Active Problem List   Diagnosis Date Noted  . GERD 03/01/2009  . HEADACHE 03/01/2009    Past Medical History:  Diagnosis Date  . 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 GERD 03/01/2009  . Headache(784.0) 03/01/2009  . Hiatal hernia   . IBS (irritable bowel syndrome)    worse in college  . Viral conjunctivitis    h/o left eye    Past Surgical History:  Procedure Laterality Date  . ABDOMINAL HYSTERECTOMY  12/09  . 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 ESOPHAGOGASTRODUODENOSCOPY  2014   NL - [place]  . LEEP  12/08   10/98 and then 12/08 CIN II/III involving glandsand with positive endocervical margin    MEDS:   Current Outpatient Medications on File Prior to Visit  Medication Sig Dispense Refill  . diphenhydrAMINE (BENADRYL) 25 mg capsule Take 25 mg by mouth every 6 (six) hours as needed for allergies.     Marland Kitchen escitalopram (LEXAPRO) 10 MG  tablet Take 1 tablet (10 mg total) by mouth daily. (Patient taking differently: Take 5 mg by mouth daily. ) 90 tablet 4  . estradiol (ESTRACE) 0.5 MG tablet Take 1 tablet (0.5 mg total) by mouth daily. (Patient taking differently: Take 0.25 mg by mouth daily. ) 90 tablet 4  . ibuprofen (ADVIL,MOTRIN) 200 MG tablet Take 200 mg by mouth every 6 (six) hours as needed for moderate pain.    . Naproxen Sodium 220 MG CAPS Take 220 mg by mouth every 12 (twelve) hours as needed (pain).     . RABEprazole (ACIPHEX) 20 MG tablet TAKE 1 TABLET (20 MG TOTAL) BY MOUTH DAILY. GENERIC NECESSARY 90 tablet 1  . Wheat Dextrin (BENEFIBER PO) Take 1 packet by mouth daily as needed (supplement).     No current facility-administered medications on file prior to visit.     ALLERGIES: Penicillins  Family History  Problem Relation Age of Onset  . Arthritis Other   . Alcohol abuse Other   . Cancer Other        lung  . Diabetes Maternal Grandmother   . Osteoporosis Maternal Grandmother   . Hypertension Mother   . Colon polyps Mother   . Diverticulosis Mother   . Hypertension Father   . Diverticulosis Father   . Heart disease Maternal Grandfather     SH:  Married, non smoker  Review of Systems  Constitutional:       Weight gain  Endo/Heme/Allergies:       Swollen glands   All other systems reviewed and are negative.   PHYSICAL EXAMINATION:    BP 112/80 (BP Loca [id number]$5\' 6"10/2007 (Approximate)   BMI 29.50 kg/m     General appearance: alert, cooperative and appears stated age No exam performed  Assessment: PMP, on low dosed HRT H/O TAH H/O adjustment disorder after husband's MI, much improved Frustrations with weight gain  Plan: Pt will slowly wean off  Lexapro and take every other day for a month and then stop.  Knows to start back with concerns about  mood changes. Change to Climara 0.025mg  patches weekly.  #4/12RF.  Will call with any concerns.   ~15 minutes spent with patient >50% of time was in face to face discussion of above.

## 2018-03-13 DIAGNOSIS — L720 Epidermal cyst: Secondary | ICD-10-CM | POA: Diagnosis not present

## 2018-03-13 DIAGNOSIS — L738 Other specified follicular disorders: Secondary | ICD-10-CM | POA: Diagnosis not present

## 2018-03-15 DIAGNOSIS — E28319 Asymptomatic premature menopause: Secondary | ICD-10-CM | POA: Diagnosis not present

## 2018-03-15 DIAGNOSIS — R945 Abnormal results of liver function studies: Secondary | ICD-10-CM

## 2018-03-15 DIAGNOSIS — F430 Acute stress reaction: Secondary | ICD-10-CM | POA: Diagnosis not present

## 2018-03-15 DIAGNOSIS — R7989 Other specified abnormal findings of blood chemistry: Secondary | ICD-10-CM | POA: Insufficient documentation

## 2018-03-15 DIAGNOSIS — R635 Abnormal weight gain: Secondary | ICD-10-CM | POA: Diagnosis not present

## 2018-03-15 DIAGNOSIS — Z0000 Encounter for general adult medical examination without abnormal findings: Secondary | ICD-10-CM | POA: Diagnosis not present

## 2018-04-17 ENCOUNTER — Other Ambulatory Visit: Payer: Self-pay

## 2018-04-17 NOTE — Telephone Encounter (Signed)
Medication refill request: Estradiol td patch 90 day supply  Last AEX:  07/30/17 Next AEX: 10/15/18 Last MMG (if hormonal medication request): 12/19/17 Benign  Refill authorized: Please refill if appropriate.

## 2018-04-19 MED ORDER — ESTRADIOL 0.025 MG/24HR TD PTWK
0.0250 mg | MEDICATED_PATCH | TRANSDERMAL | 3 refills | Status: DC
Start: 2018-04-19 — End: 2018-08-29

## 2018-05-01 DIAGNOSIS — M94269 Chondromalacia, unspecified knee: Secondary | ICD-10-CM | POA: Diagnosis not present

## 2018-05-01 DIAGNOSIS — M9420 Chondromalacia, unspecified site: Secondary | ICD-10-CM | POA: Diagnosis not present

## 2018-05-01 DIAGNOSIS — R52 Pain, unspecified: Secondary | ICD-10-CM | POA: Diagnosis not present

## 2018-05-10 DIAGNOSIS — R531 Weakness: Secondary | ICD-10-CM | POA: Diagnosis not present

## 2018-05-10 DIAGNOSIS — M2242 Chondromalacia patellae, left knee: Secondary | ICD-10-CM | POA: Diagnosis not present

## 2018-05-10 DIAGNOSIS — R29898 Other symptoms and signs involving the musculoskeletal system: Secondary | ICD-10-CM | POA: Diagnosis not present

## 2018-05-14 IMAGING — DX DG CHEST 2V
2 series · 2 of 2 positions shown · non-contrast
Comparison: 09/04/2016

CLINICAL DATA: Nonproductive cough for 2 months.

EXAM:
CHEST  2 VIEW

[chest pa]
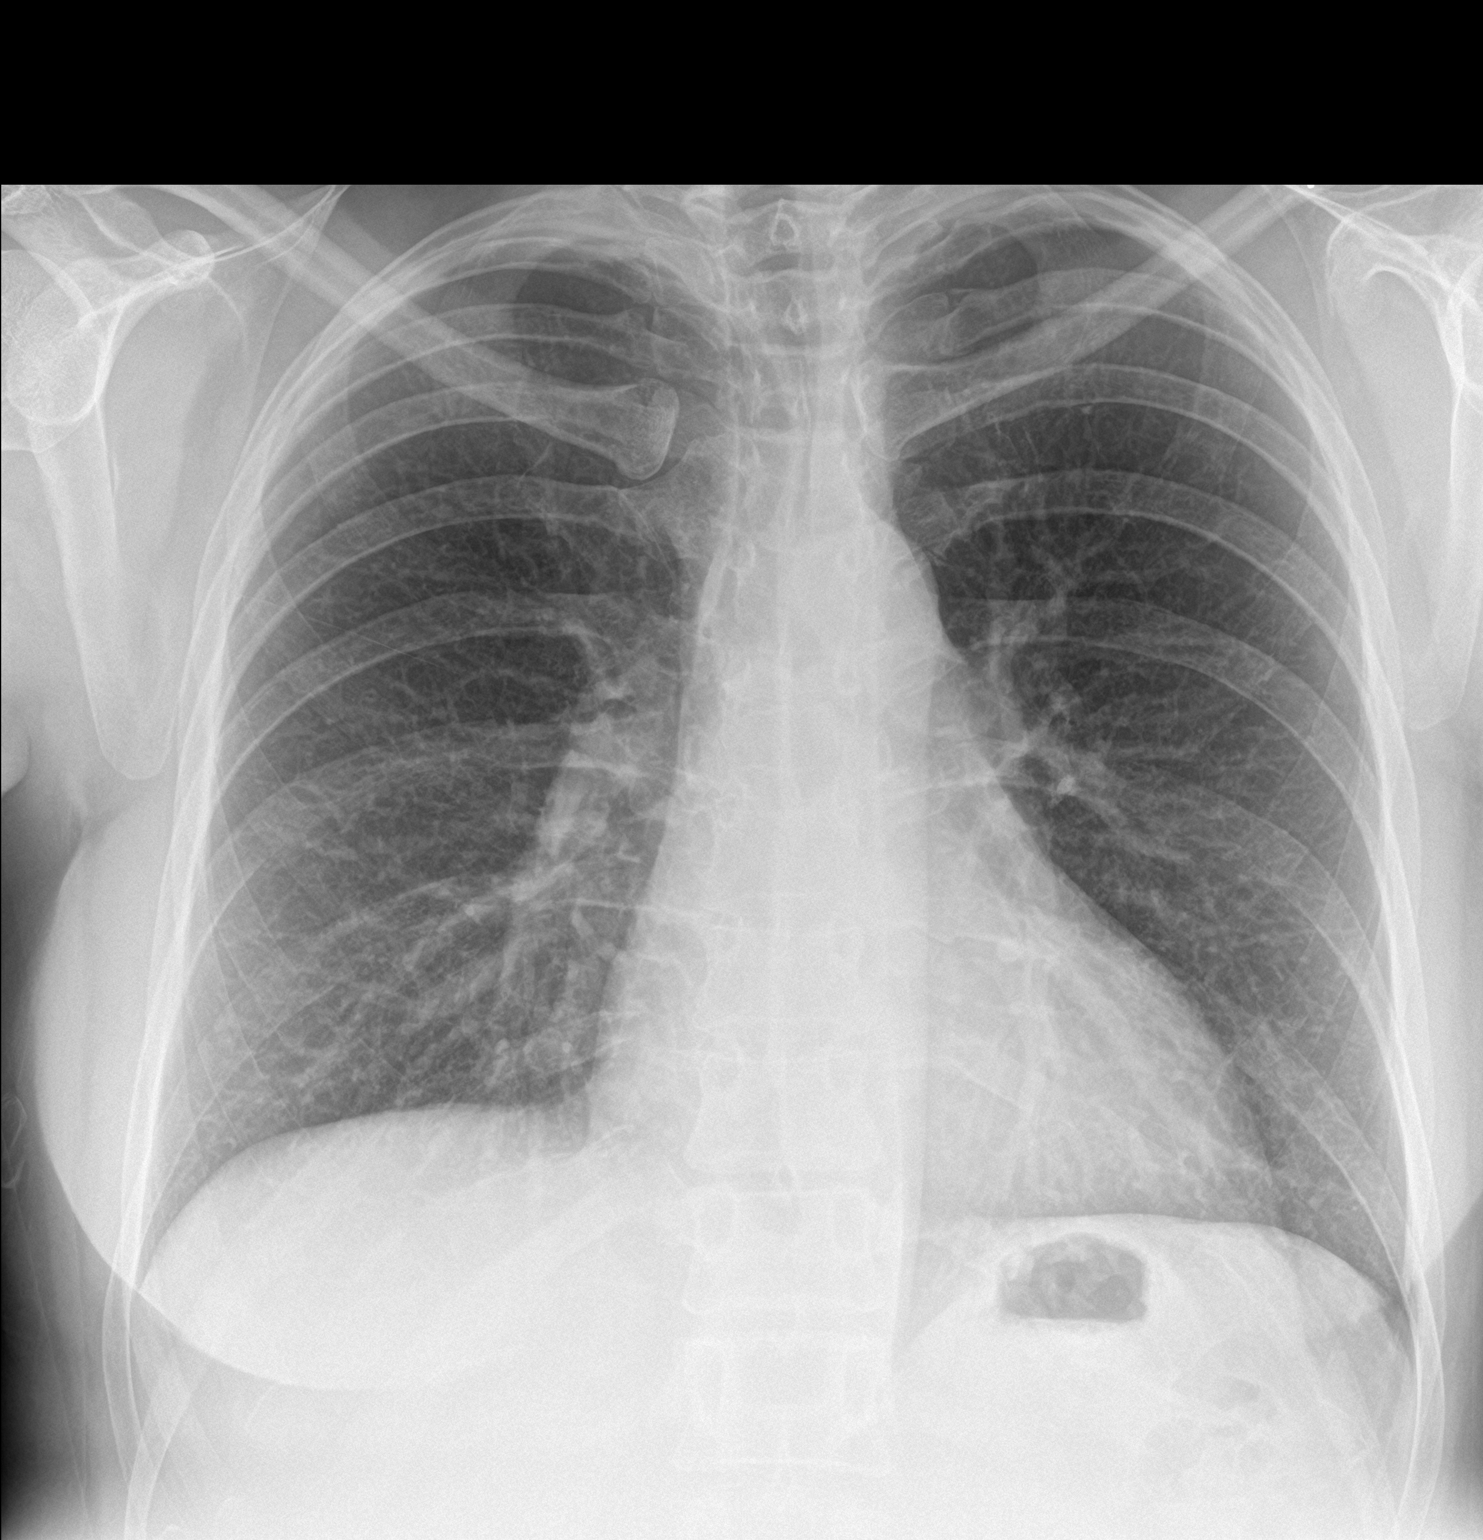

[chest lat]
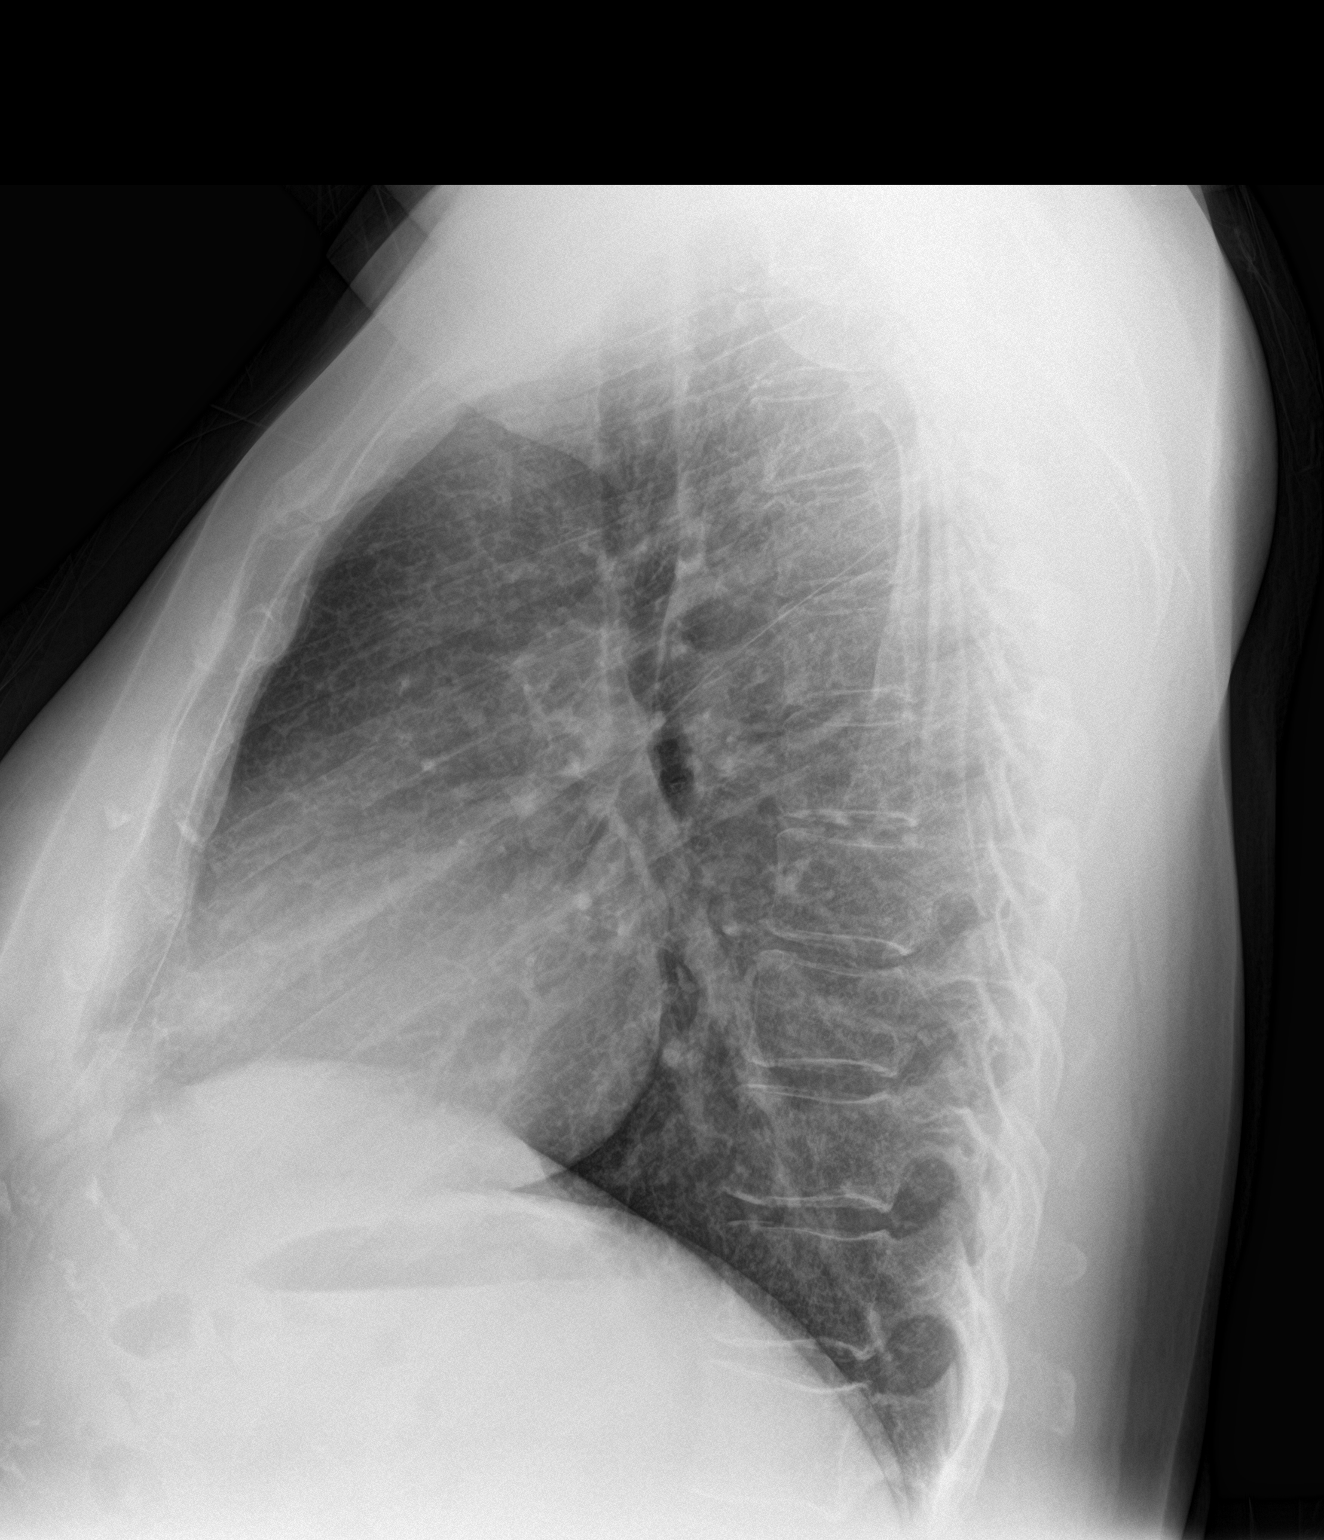

[2 of 2 positions shown; findings below may reference images not displayed]

FINDINGS: The heart size and mediastinal contours are within normal limits.
Both lungs are clear. The visualized skeletal structures are
unremarkable.
IMPRESSION: Normal chest.

## 2018-06-03 ENCOUNTER — Telehealth: Payer: Self-pay | Admitting: Obstetrics & Gynecology

## 2018-06-03 ENCOUNTER — Encounter: Payer: Self-pay | Admitting: Obstetrics & Gynecology

## 2018-06-03 NOTE — Telephone Encounter (Signed)
Call to patient. Patient reports for the past 2 days both breasts have been very sore and they are not normally. Denies feeling any lumps, fever or rash. History of TAH. Last MMG 12-13-17. Patient states she has fibrous breasts and they saw something on her last MMG and did a biopsy in March, "but it was fine." OV offered and patient agreeable to schedule. Patient scheduled for Tuesday 06-04-18 at 1530 with Dr. Hyacinth MeekerMiller. Patient agreeable to date and time of appointment.   Routing to provider for final review. Patient agreeable to disposition. Will close encounter.

## 2018-06-03 NOTE — Telephone Encounter (Signed)
Patient sent the following correspondence through MyChart. Routing to triage to assist patient with request.  Message   Hi Dr Hyacinth MeekerMiller,    I have had some painful breast soreness, both breasts, for about 2 days. I don't usually have this, I am chalking it up to "that time of the month" but since it is new and I don't know when my time is ;) I wanted to get your advice.    Thank you and hope all is well.  Monica Bailey   Last seen: 02/20/18

## 2018-06-04 ENCOUNTER — Ambulatory Visit: Payer: BLUE CROSS/BLUE SHIELD | Admitting: Obstetrics & Gynecology

## 2018-06-04 ENCOUNTER — Other Ambulatory Visit: Payer: Self-pay

## 2018-06-04 ENCOUNTER — Encounter: Payer: Self-pay | Admitting: Obstetrics & Gynecology

## 2018-06-04 VITALS — BP 132/90 | HR 72 | Resp 16 | Ht 66.0 in | Wt 186.4 lb

## 2018-06-04 DIAGNOSIS — N644 Mastodynia: Secondary | ICD-10-CM

## 2018-06-04 NOTE — Progress Notes (Signed)
GYNECOLOGY  VISIT  CC:   Breast sorness  HPI: 42 y.o. G0P0000 Married Caucasian female here for bilateral breast soreness x 3 days.  They also feel a little more full.  Denies trauma.  No bruising.  Denies nipple discharge.  Is having some increased acne as well.  H/o hysterectomy so does not have any bleeding.  Denies pelvic pain.  GYNECOLOGIC HISTORY: Patient's last menstrual period was 10/17/2007 (approximate).  Contraception: hysterectomy  Menopausal hormone therapy: climara patch   Patient Active Problem List   Diagnosis Date Noted  . GERD 03/01/2009  . HEADACHE 03/01/2009    Past Medical History:  Diagnosis Date  . 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 GERD 03/01/2009  . Headache(784.0) 03/01/2009  . Hiatal hernia   . IBS (irritable bowel syndrome)    worse in college  . Viral conjunctivitis    h/o left eye    Past Surgical History:  Procedure Laterality Date  . ABDOMINAL HYSTERECTOMY  12/09  . 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 ESOPHAGOGASTRODUODENOSCOPY  2014   [place] - [place]  . LEEP  12/08   10/98 and then 12/08 CIN II/III involving glandsand with positive endocervical margin    MEDS:   Current Outpatient Medications on File Prior to Visit  Medication Sig Dispense Refill  . diphenhydrAMINE (BENADRYL) 25 mg capsule Take 25 mg by mouth every 6 (six) hours as needed for allergies.     Marland Kitchen. estradiol (CLIMARA - DOSED IN MG/24 HR) 0.025 mg/24hr patch Place 1 patch (0.025 mg total) onto the skin once a week. 12 patch 3  . ibuprofen (ADVIL,MOTRIN) 200 MG tablet Take 200 mg by mouth every 6 (six) hours as needed for moderate pain.    . Naproxen Sodium 220 MG CAPS Take 220 mg by mouth every 12 (twelve) hours as needed (pain).     . RABEprazole (ACIPHEX) 20 MG tablet TAKE 1 TABLET (20 MG TOTAL) BY MOUTH DAILY. GENERIC NECESSARY 90 tablet 1  . Wheat  Dextrin (BENEFIBER PO) Take 1 packet by mouth daily as needed (supplement).     No current facility-administered medications on file prior to visit.     ALLERGIES: Penicillins  Family History  Problem Relation Age of Onset  . Arthritis Other   . Alcohol abuse Other   . Cancer Other        lung  . Diabetes Maternal Grandmother   . Osteoporosis Maternal Grandmother   . Hypertension Mother   . Colon polyps Mother   . Diverticulosis Mother   . Hypertension Father   . Diverticulosis Father   . Heart disease Maternal Grandfather     SH:  Married, non smoker  Review of Systems  Constitutional:       Weight gain  Breast pain   All other systems reviewed and are negative.   PHYSICAL EXAMINATION:    BP 132/90 (BP Location: Right Arm, Patient Position: Sitting, Cuff Size: Large)   Pulse 72   Resp 16   Ht 5\' 6"  (1.676 m)   Wt 186 lb 6.4 oz (84.6 kg)   LMP 10/17/2007 (Approximate)   BMI 30.09 kg/m     Physical Exam  Constitutional: She appears well-developed and well-nourished.  Neck: Normal range of motion. Neck supple. No thyromegaly present.  Respiratory: Right breast exhibits no inverted nipple, no mass, no nipple discharge, no  skin change and no tenderness. Left breast exhibits no inverted nipple, no mass, no nipple discharge, no skin change and no tenderness. Breasts are symmetrical.  Lymphadenopathy:    She has no cervical adenopathy.    Assessment: Breast tenderness  Plan: FSH and estradiol will be obtained.  If not in full menopausal range, she will stop patch to see if this helps breast pain.

## 2018-06-05 LAB — ESTRADIOL: Estradiol: 50.7 pg/mL

## 2018-06-05 LAB — FOLLICLE STIMULATING HORMONE: FSH: 1.9 m[IU]/mL

## 2018-06-07 ENCOUNTER — Telehealth: Payer: Self-pay | Admitting: Emergency Medicine

## 2018-06-07 NOTE — Telephone Encounter (Signed)
-----   Message from [name], MD sent at 06/07/2018  7:30 AM EDT ----- Please let pt know her FSH is low and estradiol is 50.  These are not menopausal range.  I would recommend stopping the patch for now.  I'd like an update next week as her breast tenderness should improve.  I would just wait and see if she starts having any hot flashes or night sweats before restarting.  Thanks.

## 2018-06-07 NOTE — Telephone Encounter (Signed)
Spoke with patient and message from Dr. Hyacinth MeekerMiller discussed.  She will stop her patch and then see how she feels and call back with update in one week.  Encounter closed.

## 2018-06-19 DIAGNOSIS — R922 Inconclusive mammogram: Secondary | ICD-10-CM | POA: Diagnosis not present

## 2018-06-27 DIAGNOSIS — Z789 Other specified health status: Secondary | ICD-10-CM | POA: Diagnosis not present

## 2018-08-07 DIAGNOSIS — Z79899 Other long term (current) drug therapy: Secondary | ICD-10-CM | POA: Diagnosis not present

## 2018-08-07 DIAGNOSIS — Z7289 Other problems related to lifestyle: Secondary | ICD-10-CM | POA: Diagnosis not present

## 2018-08-07 DIAGNOSIS — R945 Abnormal results of liver function studies: Secondary | ICD-10-CM | POA: Diagnosis not present

## 2018-08-25 ENCOUNTER — Encounter: Payer: Self-pay | Admitting: Obstetrics & Gynecology

## 2018-08-26 ENCOUNTER — Telehealth: Payer: Self-pay | Admitting: *Deleted

## 2018-08-26 NOTE — Telephone Encounter (Signed)
Usually the increased breast tenderness and acne is evidence that she is making a little estrogen, not estrogen deficiency.  Estrogen deficient is vaginal dryness, hot flashes and night sweats.  So, I don't think she should restart the patch.  We could restart a low dosed OCP as this would help the acne, if she desired this option.

## 2018-08-26 NOTE — Telephone Encounter (Signed)
Hi Dr. Hyacinth Meeker,    So the last few days some of the low estrogen symptoms have come back... breast tenderness & breakouts. I wonder the best way to manage the fluctuating estrogen levels? Should I use the patch when they come back until the symptoms clear up and then stop using it? I have some since I stopped using them when my levels were back up.    Appreciate your thoughts, thank you!  Monica Bailey

## 2018-08-26 NOTE — Telephone Encounter (Signed)
Spoke with patient. She states that breast tenderness did improve after stopping climara in August.  Now having minimal hot flashes, bilateral breast tenderness, slight anxiety, and increased facial acne.  She states she still has some of her climara patches and didn't know if Dr. Hyacinth Meeker thought she should restart.  Advised patient will send message to Dr. Hyacinth Meeker and return call. Pt agreeable.

## 2018-08-27 NOTE — Telephone Encounter (Signed)
Spoke with patient. Agreeable to start new OCP.  Needs 3 month supply sent to [facility].  Advised will send her response to Dr. Hyacinth Meeker for order and will send in rx. Call back with update on how she is feeling.

## 2018-08-29 MED ORDER — NORETHIN-ETH ESTRAD-FE BIPHAS 1 MG-10 MCG / 10 MCG PO TABS: 1.0000 | ORAL_TABLET | Freq: Every day | ORAL | 3 refills | Status: DC

## 2018-08-29 NOTE — Telephone Encounter (Signed)
[facility] Dr. Hyacinth MeekerMiller.  Encounter closed.

## 2018-09-09 ENCOUNTER — Encounter: Payer: Self-pay | Admitting: Obstetrics & Gynecology

## 2018-09-09 ENCOUNTER — Other Ambulatory Visit: Payer: Self-pay

## 2018-09-09 ENCOUNTER — Ambulatory Visit: Payer: BLUE CROSS/BLUE SHIELD | Admitting: Obstetrics & Gynecology

## 2018-09-09 ENCOUNTER — Other Ambulatory Visit (HOSPITAL_COMMUNITY)
Admission: RE | Admit: 2018-09-09 | Discharge: 2018-09-09 | Disposition: A | Discharge status: Home / Self Care | Payer: BLUE CROSS/BLUE SHIELD | Source: Ambulatory Visit | Attending: Obstetrics & Gynecology | Admitting: Obstetrics & Gynecology

## 2018-09-09 ENCOUNTER — Encounter

## 2018-09-09 VITALS — BP 118/80 | Ht 66.25 in | Wt 179.0 lb

## 2018-09-09 DIAGNOSIS — Z124 Encounter for screening for malignant neoplasm of cervix: Secondary | ICD-10-CM | POA: Insufficient documentation

## 2018-09-09 DIAGNOSIS — Z01419 Encounter for gynecological examination (general) (routine) without abnormal findings: Secondary | ICD-10-CM | POA: Diagnosis not present

## 2018-09-09 NOTE — Progress Notes (Signed)
42 y.o. G0P0000 [name] here for annual exam.  Started Lo loestrin about a week ago.  Not having any side effects.  Reports she is not having breast tenderness at this point.  Acne has improved as well.    Patient's last menstrual period was 10/17/2007 (approximate).          Sexually active: Yes.    The current method of family planning is status post hysterectomy.    Exercising: Yes.    walking & HIIT Smoker:  no  Health Maintenance: Pap:  07/30/17 Neg   07/14/16 Neg. HR HPV:neg  History of abnormal Pap:  Yes LEEP MMG:  06/19/18 Diagnostic Right BIRADS2:benign. Screening due 11/2018 Colonoscopy:  08/09/16 normal. F/u 10 years  BMD:  none TDaP:  2018  Screening Labs: done in October with new PCP Dr. Turner Daniels   reports that she quit smoking about 21 years ago. Her smoking use included cigarettes. She has a 6.00 pack-year smoking history. She has never used smokeless tobacco. She reports that she drinks about 7.0 - 10.0 standard drinks of alcohol per week. She reports that she does not use drugs.  Past Medical History:  Diagnosis Date  . 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 GERD 03/01/2009  . Headache(784.0) 03/01/2009  . Hiatal hernia   . IBS (irritable bowel syndrome)    worse in college  . Viral conjunctivitis    h/o left eye    Past Surgical History:  Procedure Laterality Date  . ABDOMINAL HYSTERECTOMY  12/09  . 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 ESOPHAGOGASTRODUODENOSCOPY  2014   NL - [place]  . LEEP  12/08   10/98 and then 12/08 CIN II/III involving glandsand with positive endocervical margin    Current Outpatient Medications  Medication Sig Dispense Refill  . diphenhydrAMINE (BENADRYL) 25 mg capsule Take 25 mg by mouth every 6 (six) hours as needed for allergies.     Marland Kitchen ibuprofen (ADVIL,MOTRIN) 200 MG tablet Take 200 mg  by mouth every 6 (six) hours as needed for moderate pain.    . naltrexone (DEPADE) 50 MG tablet Take by mouth daily.    . Naproxen Sodium 220 MG CAPS Take 220 mg by mouth every 12 (twelve) hours as needed (pain).     . Norethindrone-Ethinyl Estradiol-Fe Biphas (LO LOESTRIN FE) 1 MG-10 MCG / 10 MCG tablet Take 1 tablet by mouth daily. 3 Package 3  . Wheat Dextrin (BENEFIBER PO) Take 1 packet by mouth daily as needed (supplement).     No current facility-administered medications for this visit.     Family History  Problem Relation Age of Onset  . Arthritis Other   . Alcohol abuse Other   . Cancer Other        lung  . Diabetes Maternal Grandmother   . Osteoporosis Maternal Grandmother   . Hypertension Mother   . Colon polyps Mother   . Diverticulosis Mother   . Hypertension Father   . Diverticulosis Father   . Heart disease Maternal Grandfather     Review of Systems  Constitutional:       Weight gain  HENT:       Sinusitis  Eyes: Negative.   Respiratory: Negative.   Cardiovascular: Negative.   Gastrointestinal: Negative.   Endocrine: Negative.   Genitourinary: Negative.   Allergic/Immunologic: Negative.  Neurological: Negative.   Hematological: Negative.   Psychiatric/Behavioral: Negative.     Exam:   BP 118/80   Ht 5' 6.25" (1.683 m)   Wt 179 lb (81.2 kg)   LMP 10/17/2007 (Approximate)   BMI 28.67 kg/m   Height: 5' 6.25" (168.3 cm)  Ht Readings from Last 3 Encounters:  09/09/18 5' 6.25" (1.683 m)  06/04/18 5\' 6"  (1.676 m)  02/20/18 5\' 6"  (1.676 m)    General appearance: alert, cooperative and appears stated age Head: Normocephalic, without obvious abnormality, atraumatic Neck: no adenopathy, supple, symmetrical, trachea midline and thyroid normal to inspection and palpation Lungs: clear to auscultation bilaterally Breasts: normal appearance, no masses or tenderness Heart: regular rate and rhythm Abdomen: soft, non-tender; bowel sounds normal; no masses,  no  organomegaly Extremities: extremities normal, atraumatic, no cyanosis or edema Skin: Skin color, texture, turgor normal. No rashes or lesions Lymph nodes: Cervical, supraclavicular, and axillary nodes normal. No abnormal inguinal nodes palpated Neurologic: Grossly normal   Pelvic: External genitalia:  no lesions              Urethra:  normal appearing urethra with no masses, tenderness or lesions              Bartholins and Skenes: normal                 Vagina: normal appearing vagina with normal color and discharge, no lesions              Cervix: absent              Pap taken: Yes.   Bimanual Exam:  Uterus:  uterus absent              Adnexa: normal adnexa and no mass, fullness, tenderness               Rectovaginal: Confirms               Anus:  normal sphincter tone, no lesions  Chaperone was present for exam.  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  Follow up due 11/2018 pap smear obtained.  Neg HR HPV obtaine d9/17 Have switched to Loloestrin to help with acne and fluctuating hormonal symptoms.  Reviewed CDC recommendations for OCP use with specific liver disorders Has follow-up next week with Dr. Turner Danielsyter-Brown. Pt knows to call me if enzymes are further increased.   Lab work done in October and reviewed in Care Everywhere. return annually or prn

## 2018-09-11 DIAGNOSIS — L814 Other melanin hyperpigmentation: Secondary | ICD-10-CM | POA: Diagnosis not present

## 2018-09-11 DIAGNOSIS — D225 Melanocytic nevi of trunk: Secondary | ICD-10-CM | POA: Diagnosis not present

## 2018-09-11 DIAGNOSIS — D224 Melanocytic nevi of scalp and neck: Secondary | ICD-10-CM | POA: Diagnosis not present

## 2018-09-11 DIAGNOSIS — D2262 Melanocytic nevi of left upper limb, including shoulder: Secondary | ICD-10-CM | POA: Diagnosis not present

## 2018-09-11 LAB — CYTOLOGY - PAP: DIAGNOSIS: NEGATIVE

## 2018-10-15 ENCOUNTER — Ambulatory Visit: Payer: BLUE CROSS/BLUE SHIELD | Admitting: Obstetrics & Gynecology

## 2018-10-18 DIAGNOSIS — K219 Gastro-esophageal reflux disease without esophagitis: Secondary | ICD-10-CM | POA: Diagnosis not present

## 2018-10-18 DIAGNOSIS — R945 Abnormal results of liver function studies: Secondary | ICD-10-CM | POA: Diagnosis not present

## 2018-12-26 DIAGNOSIS — J029 Acute pharyngitis, unspecified: Secondary | ICD-10-CM | POA: Diagnosis not present

## 2018-12-26 DIAGNOSIS — J309 Allergic rhinitis, unspecified: Secondary | ICD-10-CM | POA: Diagnosis not present

## 2018-12-30 DIAGNOSIS — J329 Chronic sinusitis, unspecified: Secondary | ICD-10-CM | POA: Diagnosis not present

## 2018-12-30 DIAGNOSIS — J40 Bronchitis, not specified as acute or chronic: Secondary | ICD-10-CM | POA: Diagnosis not present

## 2019-03-20 DIAGNOSIS — Z6827 Body mass index (BMI) 27.0-27.9, adult: Secondary | ICD-10-CM | POA: Diagnosis not present

## 2019-03-20 DIAGNOSIS — Z79899 Other long term (current) drug therapy: Secondary | ICD-10-CM | POA: Diagnosis not present

## 2019-03-20 DIAGNOSIS — Z0000 Encounter for general adult medical examination without abnormal findings: Secondary | ICD-10-CM | POA: Diagnosis not present

## 2019-05-28 ENCOUNTER — Ambulatory Visit: Payer: BC Managed Care – PPO | Admitting: Certified Nurse Midwife

## 2019-05-28 ENCOUNTER — Telehealth: Payer: Self-pay | Admitting: Obstetrics & Gynecology

## 2019-05-28 ENCOUNTER — Encounter: Payer: Self-pay | Admitting: Certified Nurse Midwife

## 2019-05-28 ENCOUNTER — Other Ambulatory Visit: Payer: Self-pay

## 2019-05-28 VITALS — BP 120/78 | HR 70 | Temp 97.4°F | Resp 16 | Wt 175.0 lb

## 2019-05-28 DIAGNOSIS — R319 Hematuria, unspecified: Secondary | ICD-10-CM | POA: Diagnosis not present

## 2019-05-28 DIAGNOSIS — N898 Other specified noninflammatory disorders of vagina: Secondary | ICD-10-CM | POA: Diagnosis not present

## 2019-05-28 DIAGNOSIS — N390 Urinary tract infection, site not specified: Secondary | ICD-10-CM

## 2019-05-28 LAB — POCT URINALYSIS DIPSTICK
Bilirubin, UA: NEGATIVE
Glucose, UA: NEGATIVE
Ketones, UA: NEGATIVE
Leukocytes, UA: NEGATIVE
Nitrite, UA: NEGATIVE
Protein, UA: NEGATIVE
Urobilinogen, UA: NEGATIVE E.U./dL — AB
pH, UA: 5 (ref 5.0–8.0)

## 2019-05-28 MED ORDER — NITROFURANTOIN MONOHYD MACRO 100 MG PO CAPS: ORAL_CAPSULE | ORAL | 0 refills | Status: DC

## 2019-05-28 NOTE — Telephone Encounter (Signed)
Patient is calling regarding frequent urination and bloating.

## 2019-05-28 NOTE — Telephone Encounter (Signed)
Spoke with patient. Reports feeling bloated, frequent urination, voiding small amounts for the past 2wks. Lower back pain and discomfort at site of hysterectomy scar. Denies blood in urine, dysuria, fever/chills, N/V, vaginal d/c or odor. Advised OV recommended for further evaluation, patient is aware Dr. Sabra Heck is out of the office this week, agreeable to scheduling with covering provider. OV scheduled for today at 4pm with Melvia Heaps, CNM. Covid 19 prescreen negative, precautions reviewed.   Encounter closed.

## 2019-05-28 NOTE — Progress Notes (Signed)
43 y.o. Married Caucasian female G0P0000 here with complaint of UTI, with onset  on two weeks ago. Patient complaining of urinary frequency/urgency/ and pain with urination. Feels like it has increased in the past 5 days. Patient denies fever, chills or nausea. Some back pain. No new personal products. Has been on paddle board and in wet swim suits. Patient feels not related to sexual activity. Denies any vaginal symptoms.   Continues with  OCP for early menopause. Occasional vaginal dryness. Patient drinks adequate water intake, limited soda and caffeine. No other health issues today.  Review of Systems  Constitutional: Negative.   HENT: Negative.   Eyes: Negative.   Respiratory: Negative.   Cardiovascular: Negative.   Gastrointestinal: Negative.   Genitourinary: Positive for frequency and urgency.       Back pain, bloating  Musculoskeletal: Negative.   Skin: Negative.   Neurological: Negative.   Endo/Heme/Allergies: Negative.   Psychiatric/Behavioral: Negative.     O: Healthy female WDWN Affect: Normal, orientation x 3 Skin : warm and dry CVAT: slight positive on right only Abdomen: positive for suprapubic tenderness  poct urine-rbc tr  Pelvic exam: External genital area: normal, no lesions Bladder,Urethra tender, Urethral meatus: tender, red Vagina: white thick vaginal discharge, normal appearance  Affirm taken Cervix: normal, non tender Uterus:normal,non tender Adnexa: normal non tender, no fullness or masses   A: UTI Normal pelvic exam R/O vaginal infection  P: Reviewed findings of UTI and need for treatment. [id number] see order with instructions HYI:FOYDX micro, culture Reviewed warning signs and symptoms of UTI and need to advise if occurring. Encouraged to limit soda, tea, and coffee and be sure to increase water intake. Lab: Affirm   RV prn

## 2019-05-28 NOTE — Telephone Encounter (Signed)
Left message to call Brigitta Pricer, RN at [facility] [phone].   

## 2019-05-28 NOTE — Patient Instructions (Signed)
Urinary Tract Infection, Adult A urinary tract infection (UTI) is an infection of any part of the urinary tract. The urinary tract includes:  The kidneys.  The ureters.  The bladder.  The urethra. These organs make, store, and get rid of pee (urine) in the body. What are the causes? This is caused by germs (bacteria) in your genital area. These germs grow and cause swelling (inflammation) of your urinary tract. What increases the risk? You are more likely to develop this condition if:  You have a small, thin tube (catheter) to drain pee.  You cannot control when you pee or poop (incontinence).  You are female, and: ? You use these methods to prevent pregnancy: ? A medicine that kills sperm (spermicide). ? A device that blocks sperm (diaphragm). ? You have low levels of a female hormone (estrogen). ? You are pregnant.  You have genes that add to your risk.  You are sexually active.  You take antibiotic medicines.  You have trouble peeing because of: ? A prostate that is bigger than normal, if you are female. ? A blockage in the part of your body that drains pee from the bladder (urethra). ? A kidney stone. ? A nerve condition that affects your bladder (neurogenic bladder). ? Not getting enough to drink. ? Not peeing often enough.  You have other conditions, such as: ? Diabetes. ? A weak disease-fighting system (immune system). ? Sickle cell disease. ? Gout. ? Injury of the spine. What are the signs or symptoms? Symptoms of this condition include:  Needing to pee right away (urgently).  Peeing often.  Peeing small amounts often.  Pain or burning when peeing.  Blood in the pee.  Pee that smells bad or not like normal.  Trouble peeing.  Pee that is cloudy.  Fluid coming from the vagina, if you are female.  Pain in the belly or lower back. Other symptoms include:  Throwing up (vomiting).  No urge to eat.  Feeling mixed up (confused).  Being tired  and grouchy (irritable).  A fever.  Watery poop (diarrhea). How is this treated? This condition may be treated with:  Antibiotic medicine.  Other medicines.  Drinking enough water. Follow these instructions at home:  Medicines  Take over-the-counter and prescription medicines only as told by your doctor.  If you were prescribed an antibiotic medicine, take it as told by your doctor. Do not stop taking it even if you start to feel better. General instructions  Make sure you: ? Pee until your bladder is empty. ? Do not hold pee for a long time. ? Empty your bladder after sex. ? Wipe from front to back after pooping if you are a female. Use each tissue one time when you wipe.  Drink enough fluid to keep your pee pale yellow.  Keep all follow-up visits as told by your doctor. This is important. Contact a doctor if:  You do not get better after 1-2 days.  Your symptoms go away and then come back. Get help right away if:  You have very bad back pain.  You have very bad pain in your lower belly.  You have a fever.  You are sick to your stomach (nauseous).  You are throwing up. Summary  A urinary tract infection (UTI) is an infection of any part of the urinary tract.  This condition is caused by germs in your genital area.  There are many risk factors for a UTI. These include having a small, thin   tube to drain pee and not being able to control when you pee or poop.  Treatment includes antibiotic medicines for germs.  Drink enough fluid to keep your pee pale yellow. This information is not intended to replace advice given to you by your health care provider. Make sure you discuss any questions you have with your health care provider. Document Released: 03/20/2008 Document Revised: 09/19/2018 Document Reviewed: 04/11/2018 Elsevier Patient Education  2020 Elsevier Inc.  

## 2019-05-29 LAB — URINALYSIS, MICROSCOPIC ONLY: Casts: NONE SEEN /lpf

## 2019-05-29 LAB — VAGINITIS/VAGINOSIS, DNA PROBE
Candida Species: NEGATIVE
Gardnerella vaginalis: NEGATIVE
Trichomonas vaginosis: NEGATIVE

## 2019-05-30 LAB — URINE CULTURE

## 2019-06-18 ENCOUNTER — Other Ambulatory Visit: Payer: Self-pay

## 2019-06-18 ENCOUNTER — Encounter: Payer: Self-pay | Admitting: Obstetrics & Gynecology

## 2019-06-18 ENCOUNTER — Ambulatory Visit: Payer: BC Managed Care – PPO | Admitting: Obstetrics & Gynecology

## 2019-06-18 ENCOUNTER — Telehealth: Payer: Self-pay | Admitting: Obstetrics & Gynecology

## 2019-06-18 VITALS — BP 140/96 | HR 80 | Temp 98.0°F | Ht 66.25 in | Wt 174.0 lb

## 2019-06-18 DIAGNOSIS — R140 Abdominal distension (gaseous): Secondary | ICD-10-CM

## 2019-06-18 DIAGNOSIS — N951 Menopausal and female climacteric states: Secondary | ICD-10-CM

## 2019-06-18 LAB — POCT URINALYSIS DIPSTICK
Bilirubin, UA: NEGATIVE
Glucose, UA: NEGATIVE
Ketones, UA: NEGATIVE
Leukocytes, UA: NEGATIVE
Nitrite, UA: NEGATIVE
Protein, UA: NEGATIVE
Urobilinogen, UA: 0.2 E.U./dL
pH, UA: 5 (ref 5.0–8.0)

## 2019-06-18 NOTE — Telephone Encounter (Signed)
Call to patient. Patient states for the last month or two she has been experiencing bloating. Now experiencing weight gain and "general aches and pains." Patient states she would like to address this "sooner rather than later." OV offered. Patient scheduled for Today at 1300 with Dr. Sabra Heck. Patient agreeable to date and time of appointment. Covid prescreening negative.   Routing to provider and will close encounter.

## 2019-06-18 NOTE — Telephone Encounter (Signed)
Message left to return call to Triage Nurse at [phone].    

## 2019-06-18 NOTE — Telephone Encounter (Signed)
Patient sent the following correspondence through [place].  Hello Dr Sabra Heck,  I have been experiencing some symptoms that lead me to believe I need to get my Estrogen checked.  Looks like it has been about a year since it was last checked. I was in a few weeks ago about bloating and back pain and the Dr who saw me determined I had a UTI.  Well the bloating isn't getting any better and my weight is going up, aside from some other things.  Please let me know if you need to see me or if I could just get bloodwork done? Either way I would like to address this soon.  Thank you, Monica Bailey

## 2019-06-18 NOTE — Progress Notes (Signed)
GYNECOLOGY  VISIT  CC:   Bloating x 2 months   HPI: 43 y.o. G0P0000 [name] here for bloating that feels like it is cyclical.  This is associated with increased anxiety as well.  This was present for about two or three days last months.  The same thing started on Monday this week.  She would like to have some hormone levels tested.    She is exercising regularly.  She is doing an on-line cardio class or she walks.  Is frustrated with weight.      She is having some constipation with the abvoe symptoms as well.    She is on loloestrin.  She is on the second week of the pills.   Feels like she may have some mild pressure after she voids.  She was treated for a UTI  8/12.  Microscopic hematuria was noted on dip u/a and micro was negative for blood.  GYNECOLOGIC HISTORY: Patient's last menstrual period was 10/17/2007 (approximate). Contraception: hysterectomy  Menopausal hormone therapy: Lo Loestrin Fe  Patient Active Problem List   Diagnosis Date Noted  . Elevated LFTs 03/15/2018  . Early menopause occurring in patient age younger than 5645 years 07/20/2017  . GERD 03/01/2009    Past Medical History:  Diagnosis Date  . 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 GERD 03/01/2009  . Headache(784.0) 03/01/2009  . Hiatal hernia   . IBS (irritable bowel syndrome)    worse in college  . Viral conjunctivitis    h/o left eye    Past Surgical History:  Procedure Laterality Date  . ABDOMINAL HYSTERECTOMY  12/09  . 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 ESOPHAGOGASTRODUODENOSCOPY  2014   NL - [place]  . LEEP  12/08   10/98 and then 12/08 CIN II/III involving glandsand with positive endocervical margin    MEDS:   Current Outpatient Medications on File Prior to Visit  Medication Sig Dispense Refill  . diphenhydrAMINE (BENADRYL) 25 mg capsule Take  25 mg by mouth every 6 (six) hours as needed for allergies.     Marland Kitchen. ibuprofen (ADVIL,MOTRIN) 200 MG tablet Take 200 mg by mouth every 6 (six) hours as needed for moderate pain.    . Naproxen Sodium 220 MG CAPS Take 220 mg by mouth every 12 (twelve) hours as needed (pain).     . Norethindrone-Ethinyl Estradiol-Fe Biphas (LO LOESTRIN FE) 1 MG-10 MCG / 10 MCG tablet Take 1 tablet by mouth daily. 3 Package 3  . omeprazole (PRILOSEC) 20 MG capsule Take 20 mg by mouth daily.    . Wheat Dextrin (BENEFIBER PO) Take 1 packet by mouth daily as needed (supplement).     No current facility-administered medications on file prior to visit.     ALLERGIES: Penicillins  Family History  Problem Relation Age of Onset  . Arthritis Other   . Alcohol abuse Other   . Cancer Other        lung  . Diabetes Maternal Grandmother   . Osteoporosis Maternal Grandmother   . Hypertension Mother   . Colon polyps Mother   . Diverticulosis Mother   . Hypertension Father   . Diverticulosis Father   . Heart disease Maternal Grandfather     SH:  Married, non smoker  Review of Systems  Gastrointestinal: Positive for abdominal distention.  Musculoskeletal: Positive for  back pain.  All other systems reviewed and are negative.   PHYSICAL EXAMINATION:    BP (!) 140/96   Pulse 80   Temp 98 F (36.7 C) (Temporal)   Ht 5' 6.25" (1.683 m)   Wt 174 lb (78.9 kg)   LMP 10/17/2007 (Approximate)   BMI 27.87 kg/m     General appearance: alert, cooperative and appears stated age Abdomen: soft, non-tender; mild distension; no masses,  no organomegaly Lymph:  no inguinal LAD noted  Pelvic: not indicated Chaperone was present for exam.  Assessment: Bloating and anxiety that is cyclical the past two months  Plan: FSH and estradiol levels obtained today.  If these are normal, she is going to consider seeing GI.   ~15 minutes spent with patient >50% of time was in face to face discussion of above.

## 2019-06-19 LAB — ESTRADIOL: Estradiol: 7.7 pg/mL

## 2019-06-19 LAB — FOLLICLE STIMULATING HORMONE: FSH: 0.4 m[IU]/mL

## 2019-06-24 ENCOUNTER — Other Ambulatory Visit: Payer: Self-pay | Admitting: *Deleted

## 2019-06-24 MED ORDER — ESTRADIOL 0.5 MG PO TABS: 0.5000 mg | ORAL_TABLET | Freq: Every day | ORAL | 0 refills | Status: DC

## 2019-07-21 ENCOUNTER — Encounter: Payer: Self-pay | Admitting: Obstetrics & Gynecology

## 2019-07-21 ENCOUNTER — Other Ambulatory Visit: Payer: Self-pay

## 2019-07-21 DIAGNOSIS — Z1231 Encounter for screening mammogram for malignant neoplasm of breast: Secondary | ICD-10-CM | POA: Diagnosis not present

## 2019-07-21 MED ORDER — ESTRADIOL 0.5 MG PO TABS: 0.5000 mg | ORAL_TABLET | Freq: Every day | ORAL | 3 refills | Status: DC

## 2019-07-21 NOTE — Telephone Encounter (Signed)
Medication refill request: Estradiol Last AEX:  09/09/2018 SM Next AEX: 01/15/2020 Last MMG (if hormonal medication request): 06/19/2018 BIRADS 2 Benign Density C Refill authorized: Pt would like a 90 day supply by mail order if possible. Pending #[phone] with no refills if appropriate.

## 2019-07-29 ENCOUNTER — Telehealth: Payer: BC Managed Care – PPO | Admitting: Obstetrics & Gynecology

## 2019-07-31 ENCOUNTER — Telehealth: Payer: BC Managed Care – PPO | Admitting: Obstetrics & Gynecology

## 2019-08-08 ENCOUNTER — Encounter: Payer: Self-pay | Admitting: Obstetrics & Gynecology

## 2019-08-08 ENCOUNTER — Telehealth (INDEPENDENT_AMBULATORY_CARE_PROVIDER_SITE_OTHER): Payer: BC Managed Care – PPO | Admitting: Obstetrics & Gynecology

## 2019-08-08 DIAGNOSIS — N951 Menopausal and female climacteric states: Secondary | ICD-10-CM

## 2019-08-08 MED ORDER — NORETHIN-ETH ESTRAD-FE BIPHAS 1 MG-10 MCG / 10 MCG PO TABS: 1.0000 | ORAL_TABLET | Freq: Every day | ORAL | 3 refills | Status: DC

## 2019-08-08 NOTE — Progress Notes (Signed)
Virtual Visit via Video Note  I connected with Monica Bailey on 08/08/19 at 11:30 AM EDT by a video enabled telemedicine application and verified that I am speaking with the correct person using two identifiers.  Location: Patient: home Provider: office   I discussed the limitations of evaluation and management by telemedicine and the availability of in person appointments. The patient expressed understanding and agreed to proceed.  History of Present Illness: 43 yo G0P0 MWF who is here for video visit after changing from Loloestrin to estradiol 0.5mg  daily.  She feels like her anxiety is improved and her bloating is improved.    We discussed seeing GI at her last visit.  She does have an appt on Monday to discuss reflux.  She has undergone one colonoscopy with Dr. Carlean Purl in 2017.  She did not have any polyps at that time.    Observations/Objective: WNWD WF, NAD  Assessment and Plan: Perimenopausal symptoms  Follow Up Instructions: Continue with estradiol 0.5mg  daily.  Does not need rx at this time.  I discussed the assessment and treatment plan with the patient. The patient was provided an opportunity to ask questions and all were answered. The patient agreed with the plan and demonstrated an understanding of the instructions.   The patient was advised to call back or seek an in-person evaluation if the symptoms worsen or if the condition fails to improve as anticipated.  I provided 10  minutes of non-face-to-face time during this encounter.   Megan Salon, MD

## 2019-08-11 DIAGNOSIS — K760 Fatty (change of) liver, not elsewhere classified: Secondary | ICD-10-CM | POA: Diagnosis not present

## 2019-08-11 DIAGNOSIS — F1010 Alcohol abuse, uncomplicated: Secondary | ICD-10-CM | POA: Diagnosis not present

## 2019-08-11 DIAGNOSIS — K219 Gastro-esophageal reflux disease without esophagitis: Secondary | ICD-10-CM | POA: Diagnosis not present

## 2019-08-11 DIAGNOSIS — R7989 Other specified abnormal findings of blood chemistry: Secondary | ICD-10-CM | POA: Diagnosis not present

## 2019-08-15 DIAGNOSIS — F1010 Alcohol abuse, uncomplicated: Secondary | ICD-10-CM | POA: Diagnosis not present

## 2019-08-15 DIAGNOSIS — R7989 Other specified abnormal findings of blood chemistry: Secondary | ICD-10-CM | POA: Diagnosis not present

## 2019-10-23 ENCOUNTER — Other Ambulatory Visit: Payer: Self-pay

## 2019-10-27 ENCOUNTER — Other Ambulatory Visit: Payer: Self-pay

## 2019-10-27 ENCOUNTER — Encounter: Payer: Self-pay | Admitting: Obstetrics & Gynecology

## 2019-10-27 ENCOUNTER — Ambulatory Visit: Payer: BC Managed Care – PPO | Admitting: Obstetrics & Gynecology

## 2019-10-27 ENCOUNTER — Other Ambulatory Visit (HOSPITAL_COMMUNITY)
Admission: RE | Admit: 2019-10-27 | Discharge: 2019-10-27 | Disposition: A | Discharge status: Home / Self Care | Payer: BC Managed Care – PPO | Source: Ambulatory Visit | Attending: Obstetrics & Gynecology | Admitting: Obstetrics & Gynecology

## 2019-10-27 VITALS — BP 102/60 | HR 68 | Temp 97.3°F | Resp 12 | Ht 66.25 in | Wt 174.0 lb

## 2019-10-27 DIAGNOSIS — Z124 Encounter for screening for malignant neoplasm of cervix: Secondary | ICD-10-CM | POA: Insufficient documentation

## 2019-10-27 DIAGNOSIS — D069 Carcinoma in situ of cervix, unspecified: Secondary | ICD-10-CM | POA: Insufficient documentation

## 2019-10-27 DIAGNOSIS — Z01419 Encounter for gynecological examination (general) (routine) without abnormal findings: Secondary | ICD-10-CM | POA: Diagnosis not present

## 2019-10-27 MED ORDER — ESTRADIOL 0.5 MG PO TABS: 0.5000 mg | ORAL_TABLET | Freq: Every day | ORAL | 4 refills | Status: DC

## 2019-10-27 NOTE — Progress Notes (Addendum)
44 y.o. G0P0000 [name] here for annual exam.  Doing well.  It was a quiet holiday.    Saw GI in October, Dr. Shary Key.  She is back in omeprazole.  This has helped.  Had liver elastography.  Has mildly elevated ALT.  This is being followed yearly.    Patient's last menstrual period was 10/17/2007 (approximate).          Sexually active: Yes.    The current method of family planning is status post hysterectomy.    Exercising: Yes.    walking Smoker:  no  Health Maintenance: Pap:  09/09/18 Neg   07/30/17 Neg              07/14/16 Neg. HR HPV:neg History of abnormal Pap:  Yes, LEEP and CKC, h/o CIN 3 MMG:  08/27/2019 Colonoscopy:  08/09/16 normal. F/u 10 years  TDaP:  2018 Screening Labs: PCP   reports that she quit smoking about 22 years ago. Her smoking use included cigarettes. She has a 6.00 pack-year smoking history. She has never used smokeless tobacco. She reports current alcohol use of about 7.0 - 10.0 standard drinks of alcohol per week. She reports that she does not use drugs.  Past Medical History:  Diagnosis Date  . 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 GERD 03/01/2009  . Headache(784.0) 03/01/2009  . Hiatal hernia   . IBS (irritable bowel syndrome)    worse in college  . Viral conjunctivitis    h/o left eye    Past Surgical History:  Procedure Laterality Date  . ABDOMINAL HYSTERECTOMY  12/09  . 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 ESOPHAGOGASTRODUODENOSCOPY  2014   NL - [place]  . LEEP  12/08   10/98 and then 12/08 CIN II/III involving glandsand with positive endocervical margin    Current Outpatient Medications  Medication Sig Dispense Refill  . Cholecalciferol (VITAMIN D3 PO) Take by mouth daily.    . diphenhydrAMINE (BENADRYL) 25 mg capsule Take 25 mg by mouth every 6 (six) hours as needed for allergies.     Marland Kitchen  estradiol (ESTRACE) 0.5 MG tablet Take 1 tablet (0.5 mg total) by mouth daily. 90 tablet 3  . ibuprofen (ADVIL,MOTRIN) 200 MG tablet Take 200 mg by mouth every 6 (six) hours as needed for moderate pain.    . Naproxen Sodium 220 MG CAPS Take 220 mg by mouth every 12 (twelve) hours as needed (pain).     Marland Kitchen omeprazole (PRILOSEC) 20 MG capsule Take 20 mg by mouth daily.    . Wheat Dextrin (BENEFIBER PO) Take 1 packet by mouth daily as needed (supplement).     No current facility-administered medications for this visit.    Family History  Problem Relation Age of Onset  . Arthritis Other   . Alcohol abuse Other   . Cancer Other        lung  . Diabetes Maternal Grandmother   . Osteoporosis Maternal Grandmother   . Hypertension Mother   . Colon polyps Mother   . Diverticulosis Mother   . Hypertension Father   . Diverticulosis Father   . Heart disease Maternal Grandfather     Review of Systems  All other systems reviewed and are negative.   Exam:   BP 102/60 (BP Location: Right Arm, Patient Position: Sitting, Cuff Size: Normal)  Pulse 68   Temp (!) 97.3 F (36.3 C) (Temporal)   Resp 12   Ht 5' 6.25" (1.683 m)   Wt 174 lb (78.9 kg)   LMP 10/17/2007 (Approximate)   BMI 27.87 kg/m   Height: 5' 6.25" (168.3 cm)  Ht Readings from Last 3 Encounters:  10/27/19 5' 6.25" (1.683 m)  06/18/19 5' 6.25" (1.683 m)  09/09/18 5' 6.25" (1.683 m)    General appearance: alert, cooperative and appears stated age Head: Normocephalic, without obvious abnormality, atraumatic Neck: no adenopathy, supple, symmetrical, trachea midline and thyroid normal to inspection and palpation Lungs: clear to auscultation bilaterally Breasts: normal appearance, no masses or tenderness Heart: regular rate and rhythm Abdomen: soft, non-tender; bowel sounds normal; no masses,  no organomegaly Extremities: extremities normal, atraumatic, no cyanosis or edema Skin: Skin color, texture, turgor normal. No rashes or  lesions Lymph nodes: Cervical, supraclavicular, and axillary nodes normal. No abnormal inguinal nodes palpated Neurologic: Grossly normal   Pelvic: External genitalia:  no lesions              Urethra:  normal appearing urethra with no masses, tenderness or lesions              Bartholins and Skenes: normal                 Vagina: normal appearing vagina with normal color and discharge, no lesions              Cervix: absent              Pap taken: Yes.   Bimanual Exam:  Uterus:  uterus absent              Adnexa: normal adnexa and no mass, fullness, tenderness               Rectovaginal: Confirms               Anus:  normal sphincter tone, no lesions  Chaperone, Zenovia Jordan, CMA, was present for exam.  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   Colonoscopy 2017 Pap and HR HPV obtained today RF for estradiol 0.5mg  daily.  #90/4RF. Blood work done with Dr. Turner Daniels Return annually or prn

## 2019-10-29 LAB — CYTOLOGY - PAP
Adequacy: ABSENT
Comment: NEGATIVE
Diagnosis: NEGATIVE
High risk HPV: NEGATIVE

## 2020-01-02 ENCOUNTER — Encounter: Payer: Self-pay | Admitting: Certified Nurse Midwife

## 2020-01-15 ENCOUNTER — Ambulatory Visit: Payer: BLUE CROSS/BLUE SHIELD | Admitting: Obstetrics & Gynecology

## 2020-08-20 ENCOUNTER — Encounter: Payer: Self-pay | Admitting: Obstetrics & Gynecology

## 2020-08-30 ENCOUNTER — Encounter: Payer: Self-pay | Admitting: Obstetrics & Gynecology

## 2020-09-24 ENCOUNTER — Encounter: Payer: Self-pay | Admitting: Nurse Practitioner

## 2020-09-24 ENCOUNTER — Other Ambulatory Visit: Payer: Self-pay

## 2020-09-24 ENCOUNTER — Ambulatory Visit: Payer: BC Managed Care – PPO | Admitting: Nurse Practitioner

## 2020-09-24 ENCOUNTER — Telehealth: Payer: Self-pay

## 2020-09-24 VITALS — BP 112/78 | HR 70 | Resp 16 | Wt 178.0 lb

## 2020-09-24 DIAGNOSIS — N907 Vulvar cyst: Secondary | ICD-10-CM

## 2020-09-24 NOTE — Telephone Encounter (Signed)
Call to patient. Reports cyst on right vaginal labia that is red and itchy and now also developing on internal vaginal area as well. Has history of vulvar sebaecous cysts and this is simialar but becoming irritated and uncomfortable. Office visit with Clarita Crane NP today at 2pm.   Routing to provider. Encounter closed.

## 2020-09-24 NOTE — Patient Instructions (Signed)
Epidermal Cyst  An epidermal cyst is a sac made of skin tissue. The sac contains a substance called keratin. Keratin is a protein that is normally secreted through the hair follicles. When keratin becomes trapped in the top layer of skin (epidermis), it can form an epidermal cyst. Epidermal cysts can be found anywhere on your body. These cysts are usually harmless (benign), and they may not cause symptoms unless they become infected. What are the causes? This condition may be caused by:  A blocked hair follicle.  A hair that curls and re-enters the skin instead of growing straight out of the skin (ingrown hair).  A blocked pore.  Irritated skin.  An injury to the skin.  Certain conditions that are passed along from parent to child (inherited).  Human papillomavirus (HPV).  Long-term (chronic) sun damage to the skin. What increases the risk? The following factors may make you more likely to develop an epidermal cyst:  Having acne.  Being overweight.  Being 30-40 years old. What are the signs or symptoms? The only symptom of this condition may be a small, painless lump underneath the skin. When an epidermal cyst ruptures, it may become infected. Symptoms may include:  Redness.  Inflammation.  Tenderness.  Warmth.  Fever.  Keratin draining from the cyst. Keratin is grayish-white, bad-smelling substance.  Pus draining from the cyst. How is this diagnosed? This condition is diagnosed with a physical exam.  In some cases, you may have a sample of tissue (biopsy) taken from your cyst to be examined under a microscope or tested for bacteria.  You may be referred to a health care provider who specializes in skin care (dermatologist). How is this treated? In many cases, epidermal cysts go away on their own without treatment. If a cyst becomes infected, treatment may include:  Opening and draining the cyst, done by a health care provider. After draining, minor surgery to  remove the rest of the cyst may be done.  Antibiotic medicine.  Injections of medicines (steroids) that help to reduce inflammation.  Surgery to remove the cyst. Surgery may be done if the cyst: ? Becomes large. ? Bothers you. ? Has a chance of turning into cancer.  Do not try to open a cyst yourself. Follow these instructions at home:  Take over-the-counter and prescription medicines only as told by your health care provider.  If you were prescribed an antibiotic medicine, take it it as told by your health care provider. Do not stop using the antibiotic even if you start to feel better.  Keep the area around your cyst clean and dry.  Wear loose, dry clothing.  Avoid touching your cyst.  Check your cyst every day for signs of infection. Check for: ? Redness, swelling, or pain. ? Fluid or blood. ? Warmth. ? Pus or a bad smell.  Keep all follow-up visits as told by your health care provider. This is important. How is this prevented?  Wear clean, dry, clothing.  Avoid wearing tight clothing.  Keep your skin clean and dry. Take showers or baths every day. Contact a health care provider if:  Your cyst develops symptoms of infection.  Your condition is not improving or is getting worse.  You develop a cyst that looks different from other cysts you have had.  You have a fever. Get help right away if:  Redness spreads from the cyst into the surrounding area. Summary  An epidermal cyst is a sac made of skin tissue. These cysts are   usually harmless (benign), and they may not cause symptoms unless they become infected.  If a cyst becomes infected, treatment may include surgery to open and drain the cyst, or to remove it. Treatment may also include medicines by mouth or through an injection.  Take over-the-counter and prescription medicines only as told by your health care provider. If you were prescribed an antibiotic medicine, take it as told by your health care  provider. Do not stop using the antibiotic even if you start to feel better.  Contact a health care provider if your condition is not improving or is getting worse.  Keep all follow-up visits as told by your health care provider. This is important. This information is not intended to replace advice given to you by your health care provider. Make sure you discuss any questions you have with your health care provider. Document Revised: 01/23/2019 Document Reviewed: 04/15/2018 Elsevier Patient Education  2020 [employer]  

## 2020-09-24 NOTE — Progress Notes (Signed)
GYNECOLOGY  VISIT  CC:   Feels like a cyst on right vulva and another cyst that is new on her left labia. Noticed both about 2 days ago and the one on the right is red and irritated  HPI: 44 y.o. G0P0000 [name] here for vaginal cyst that are itching & sore.  Denies vaginal irritation, itching or burning. All discomfort is external    GYNECOLOGIC HISTORY: Patient's last menstrual period was 10/17/2007 (approximate). Contraception: hysterectomy Menopausal hormone therapy: estrace 0.5mg  tablet  Patient Active Problem List   Diagnosis Date Noted  . Elevated LFTs 03/15/2018  . Early menopause occurring in patient age younger than 30 years 07/20/2017  . GERD 03/01/2009    Past Medical History:  Diagnosis Date  . 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 GERD 03/01/2009  . Headache(784.0) 03/01/2009  . Hiatal hernia   . IBS (irritable bowel syndrome)    worse in college  . Viral conjunctivitis    h/o left eye    Past Surgical History:  Procedure Laterality Date  . ABDOMINAL HYSTERECTOMY  12/09  . 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 ESOPHAGOGASTRODUODENOSCOPY  2014   [facility] - [place]  . LEEP  12/08   10/98 and then 12/08 CIN II/III involving glandsand with positive endocervical margin    MEDS:   Current Outpatient Medications on File Prior to Visit  Medication Sig Dispense Refill  . ALPRAZolam (XANAX) 0.25 MG tablet Take 0.25 mg by mouth 3 (three) times daily as needed.    . Cholecalciferol (VITAMIN D3 PO) Take by mouth daily.    . cyanocobalamin 1000 MCG tablet Take by mouth.    . diphenhydrAMINE (BENADRYL) 25 mg capsule Take 25 mg by mouth every 6 (six) hours as needed for allergies.    Marland Kitchen estradiol (ESTRACE) 0.5 MG tablet Take 1 tablet (0.5 mg total) by mouth daily. 90 tablet 4  . ibuprofen (ADVIL,MOTRIN) 200 MG tablet Take 200 mg  by mouth every 6 (six) hours as needed for moderate pain.    . Naproxen Sodium (ALEVE PO)     . omeprazole (PRILOSEC) 40 MG capsule Take 40 mg by mouth 2 (two) times daily.    . Wheat Dextrin (BENEFIBER PO) Take 1 packet by mouth daily as needed (supplement).     No current facility-administered medications on file prior to visit.    ALLERGIES: Penicillins  Family History  Problem Relation Age of Onset  . Arthritis Other   . Alcohol abuse Other   . Cancer Other        lung  . Diabetes Maternal Grandmother   . Osteoporosis Maternal Grandmother   . Hypertension Mother   . Colon polyps Mother   . Diverticulosis Mother   . Hypertension Father   . Diverticulosis Father   . Heart disease Maternal Grandfather    Review of Systems  Constitutional: Negative.   HENT: Negative.   Eyes: Negative.   Respiratory: Negative.   Cardiovascular: Negative.   Gastrointestinal: Negative.   Endocrine: Negative.   Genitourinary:       Vaginal cyst that are itching & sore  Musculoskeletal: Negative.   Skin: Negative.   Allergic/Immunologic: Negative.   Neurological: Negative.   Hematological: Negative.   Psychiatric/Behavioral: Negative.     PHYSICAL EXAMINATION:    BP 112/78   Pulse 70  Resp 16   Wt 178 lb (80.7 kg)   LMP 10/17/2007 (Approximate)   BMI 28.51 kg/m     General appearance: alert, cooperative and appears stated age No acute distress Lymph:  no inguinal LAD noted  Physical exam: External genitalia:  Multiple labial sebaceous cysts, ~36mm in size each. One sebaceous cyst on right labia majora, red, mildly swollen, no induration, mildly tender to palpation.                   Chaperone, Joy, CMA, was present for exam.  Assessment: Sebaceous cyst (epidermal cyst)  Plan: At this time I do not recommend any treatment except keep area clean and dry. Apply warm compress ~10-20 min twice daily if possible. Call office if: worsens, becomes indurated, or does not heal on  its own. (Pt allergic to PCN, is worsens, will consider doxycycline)

## 2020-09-24 NOTE — Telephone Encounter (Signed)
Patient has a cyst that is irritated.

## 2020-12-30 ENCOUNTER — Other Ambulatory Visit: Payer: Self-pay | Admitting: Obstetrics & Gynecology

## 2021-01-07 ENCOUNTER — Ambulatory Visit: Payer: BC Managed Care – PPO

## 2021-02-22 ENCOUNTER — Ambulatory Visit (HOSPITAL_BASED_OUTPATIENT_CLINIC_OR_DEPARTMENT_OTHER): Payer: BC Managed Care – PPO | Admitting: Obstetrics & Gynecology

## 2021-02-23 ENCOUNTER — Ambulatory Visit (INDEPENDENT_AMBULATORY_CARE_PROVIDER_SITE_OTHER): Payer: BC Managed Care – PPO | Admitting: Obstetrics & Gynecology

## 2021-02-23 ENCOUNTER — Other Ambulatory Visit: Payer: Self-pay

## 2021-02-23 ENCOUNTER — Encounter (HOSPITAL_BASED_OUTPATIENT_CLINIC_OR_DEPARTMENT_OTHER): Payer: Self-pay | Admitting: Obstetrics & Gynecology

## 2021-02-23 VITALS — BP 123/92 | HR 86 | Ht 65.5 in | Wt 178.4 lb

## 2021-02-23 DIAGNOSIS — N907 Vulvar cyst: Secondary | ICD-10-CM | POA: Diagnosis not present

## 2021-02-23 DIAGNOSIS — Z90710 Acquired absence of both cervix and uterus: Secondary | ICD-10-CM | POA: Diagnosis not present

## 2021-02-23 DIAGNOSIS — E28319 Asymptomatic premature menopause: Secondary | ICD-10-CM | POA: Diagnosis not present

## 2021-02-23 DIAGNOSIS — Z87410 Personal history of cervical dysplasia: Secondary | ICD-10-CM

## 2021-02-23 DIAGNOSIS — Z01419 Encounter for gynecological examination (general) (routine) without abnormal findings: Secondary | ICD-10-CM

## 2021-02-23 NOTE — Progress Notes (Signed)
45 y.o. G0P0000 [name] here for annual exam.  Doing well.  Just got back from trip to beach in [place].  Met her parents for the trip.  Denies vaginal bleeding.    Patient's last menstrual period was 10/17/2007 (approximate).          Sexually active: Yes.    The current method of family planning is status post hysterectomy.    Exercising: Yes.    Smoker:  no  Health Maintenance: Pap:  10/2019 History of abnormal Pap:  H/o high grade with + margins, conization x 2, then hysterectomy MMG:  08/2020 Colonoscopy:  2017, Dr. Carlean Purl.  Follow up 10 years. TDaP:  02/2017 Hep C testing: 2019 Screening Labs: done with Dr. Leonides Cave   reports that she has quit smoking. Her smoking use included cigarettes. She has never used smokeless tobacco. She reports current alcohol use of about 7.0 - 10.0 standard drinks of alcohol per week. She reports that she does not use drugs.  Past Medical History:  Diagnosis Date  . 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 GERD 03/01/2009  . Headache(784.0) 03/01/2009  . Hiatal hernia   . IBS (irritable bowel syndrome)    worse in college  . Viral conjunctivitis    h/o left eye    Past Surgical History:  Procedure Laterality Date  . ABDOMINAL HYSTERECTOMY  12/09  . 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 ESOPHAGOGASTRODUODENOSCOPY  2014   [place] - [place]  . LEEP  12/08   10/98 and then 12/08 CIN II/III involving glandsand with positive endocervical margin    Current Outpatient Medications  Medication Sig Dispense Refill  . ALPRAZolam (XANAX) 0.25 MG tablet Take 0.25 mg by mouth 3 (three) times daily as needed.    . Cholecalciferol (VITAMIN D3 PO) Take by mouth daily.    . diphenhydrAMINE (BENADRYL) 25 mg capsule Take 25 mg by mouth every 6 (six) hours as needed for allergies.    Marland Kitchen estradiol (ESTRACE) 0.5 MG tablet  TAKE 1 TABLET DAILY 90 tablet 3  . ibuprofen (ADVIL,MOTRIN) 200 MG tablet Take 200 mg by mouth every 6 (six) hours as needed for moderate pain.    . Naproxen Sodium (ALEVE PO)     . omeprazole (PRILOSEC) 40 MG capsule Take 40 mg by mouth 2 (two) times daily.    . Wheat Dextrin (BENEFIBER PO) Take 1 packet by mouth daily as needed (supplement).    . cyanocobalamin 1000 MCG tablet Take by mouth. (Patient not taking: Reported on 02/23/2021)     No current facility-administered medications for this visit.    Family History  Problem Relation Age of Onset  . Arthritis Other   . Alcohol abuse Other   . Cancer Other        lung  . Diabetes Maternal Grandmother   . Osteoporosis Maternal Grandmother   . Hypertension Mother   . Colon polyps Mother   . Diverticulosis Mother   . Hypertension Father   . Diverticulosis Father   . Heart disease Maternal Grandfather     Review of Systems  All other systems reviewed and are negative.   Exam:   BP (!) 123/92 (BP Location: Left Arm, Patient Position: Sitting, Cuff Size: Small)   Pulse 86   Ht 5' 5.5" (1.664 m)   Wt 178 lb 6.4 oz (  80.9 kg)   LMP 10/17/2007 (Approximate)   BMI 29.24 kg/m   Height: 5' 5.5" (166.4 cm)  General appearance: alert, cooperative and appears stated age Head: Normocephalic, without obvious abnormality, atraumatic Neck: no adenopathy, supple, symmetrical, trachea midline and thyroid normal to inspection and palpation Lungs: clear to auscultation bilaterally Breasts: normal appearance, no masses or tenderness Heart: regular rate and rhythm Abdomen: soft, non-tender; bowel sounds normal; no masses,  no organomegaly Extremities: extremities normal, atraumatic, no cyanosis or edema Skin: Skin color, texture, turgor normal. No rashes or lesions Lymph nodes: Cervical, supraclavicular, and axillary nodes normal. No abnormal inguinal nodes palpated Neurologic: Grossly normal   Pelvic: External genitalia:  no lesions               Urethra:  normal appearing urethra with no masses, tenderness or lesions              Bartholins and Skenes: normal                 Vagina: normal appearing vagina with normal color and no discharge, no lesions              Cervix: absent              Pap taken: No. Bimanual Exam:  Uterus:  uterus absent              Adnexa: normal adnexa and no mass, fullness, tenderness               Rectovaginal: Confirms               Anus:  normal sphincter tone, no lesions  Chaperone, Prince Rome, CMA, was present for exam.  Assessment/Plan: 1. Well woman exam with routine gynecological exam - Pap 11/2018 neg with neg HR HPV.  Repeat next year. - MMG up to date - colonoscopy 2017 - lab work with PCP - vaccines updated  2. Early menopause occurring in patient age younger than 14 years - on estradiol 0.63m daily.  Does not need rx.  3. Hx of hysterectomy - h/o CIN 3 with positive margins, conization x 2, then hsyterectomy  4. Sebaceous cyst of labia - discussed possibly removal in outpatient setting  5. History of cervical dysplasia

## 2021-05-19 ENCOUNTER — Telehealth (HOSPITAL_BASED_OUTPATIENT_CLINIC_OR_DEPARTMENT_OTHER): Payer: Self-pay | Admitting: Obstetrics & Gynecology

## 2021-05-19 NOTE — Telephone Encounter (Signed)
Patient called today and wanted to know  if Dr.Miller will take the office visit  and just her annual visit .Patient

## 2021-08-15 ENCOUNTER — Encounter (HOSPITAL_BASED_OUTPATIENT_CLINIC_OR_DEPARTMENT_OTHER): Payer: Self-pay | Admitting: *Deleted

## 2022-02-24 ENCOUNTER — Ambulatory Visit (HOSPITAL_BASED_OUTPATIENT_CLINIC_OR_DEPARTMENT_OTHER): Payer: BC Managed Care – PPO | Admitting: Obstetrics & Gynecology

## 2023-04-03 ENCOUNTER — Encounter: Payer: BC Managed Care – PPO | Admitting: Bariatrics
